# Patient Record
Sex: Male | Born: 1980
Health system: Southern US, Community
[De-identification: ages and names within clinical notes are randomized; demographics above are authoritative.]

## PROBLEM LIST (undated history)

## (undated) DIAGNOSIS — F1011 Alcohol abuse, in remission: Secondary | ICD-10-CM

## (undated) DIAGNOSIS — B019 Varicella without complication: Secondary | ICD-10-CM

## (undated) DIAGNOSIS — K219 Gastro-esophageal reflux disease without esophagitis: Secondary | ICD-10-CM

## (undated) DIAGNOSIS — F329 Major depressive disorder, single episode, unspecified: Secondary | ICD-10-CM

## (undated) DIAGNOSIS — F32A Depression, unspecified: Secondary | ICD-10-CM

## (undated) HISTORY — DX: Gastro-esophageal reflux disease without esophagitis: K21.9

## (undated) HISTORY — DX: Alcohol abuse, in remission: F10.11

## (undated) HISTORY — DX: Major depressive disorder, single episode, unspecified: F32.9

## (undated) HISTORY — DX: Depression, unspecified: F32.A

## (undated) HISTORY — PX: NO PAST SURGERIES: SHX2092

## (undated) HISTORY — DX: Varicella without complication: B01.9

---

## 2013-03-29 ENCOUNTER — Encounter: Payer: Self-pay | Admitting: Medical

## 2013-04-11 ENCOUNTER — Encounter: Payer: Self-pay | Admitting: Medical

## 2014-11-04 ENCOUNTER — Encounter: Payer: Self-pay | Admitting: Family Medicine

## 2014-11-04 ENCOUNTER — Ambulatory Visit (INDEPENDENT_AMBULATORY_CARE_PROVIDER_SITE_OTHER): Payer: BLUE CROSS/BLUE SHIELD | Admitting: Family Medicine

## 2014-11-04 VITALS — BP 106/62 | HR 93 | Temp 98.4°F | Ht 73.25 in | Wt 177.2 lb

## 2014-11-04 DIAGNOSIS — Z0000 Encounter for general adult medical examination without abnormal findings: Secondary | ICD-10-CM

## 2014-11-04 DIAGNOSIS — Z720 Tobacco use: Secondary | ICD-10-CM | POA: Diagnosis not present

## 2014-11-04 DIAGNOSIS — F329 Major depressive disorder, single episode, unspecified: Secondary | ICD-10-CM | POA: Diagnosis not present

## 2014-11-04 DIAGNOSIS — F339 Major depressive disorder, recurrent, unspecified: Secondary | ICD-10-CM | POA: Insufficient documentation

## 2014-11-04 DIAGNOSIS — Z13220 Encounter for screening for lipoid disorders: Secondary | ICD-10-CM

## 2014-11-04 DIAGNOSIS — F32A Depression, unspecified: Secondary | ICD-10-CM

## 2014-11-04 NOTE — Progress Notes (Signed)
Pre visit review using our clinic review tool, if applicable. No additional management support is needed unless otherwise documented below in the visit note. 

## 2014-11-04 NOTE — Assessment & Plan Note (Signed)
PHQ-9 = 14. Patient doing well at this time as he's had recent counseling. He declined pharmacologic treatment.  Will monitor.

## 2014-11-04 NOTE — Patient Instructions (Signed)
It was nice to see you today.  Follow at least annually.  Sooner if needed.  Take care  Dr. Adriana Simasook

## 2014-11-04 NOTE — Assessment & Plan Note (Signed)
Patient to get flu shot at work. Tetanus up-to-date per patient. Lipid panel today. Declined STD/HIV screening.

## 2014-11-04 NOTE — Progress Notes (Signed)
Subjective:  Patient ID: Wayne [name], male    DOB: 1980/05/26  Age: 34 y.o. MRN: [id number]  CC: Establish care; Depression  HPI Wayne [name] is a 34 y.o. male presents to the clinic today to establish care.  Preventative Healthcare  Immunizations: Will give flu at work. He states that his tetanus is up-to-date.  Labs: Screening lipid panel today.  Alcohol use: Has a history of alcohol abuse.  Smoking/tobacco use: Current every day smoker. He smokes a half pack a day.  STD/HIV testing: Declined.   Regular dental exams: Has upcoming dental appt.  Wears seat belt: Yes.  Depression  Patient reports that he's been experiencing depression for the past several weeks.  This started after discovering that his wife was unfaithful.  He's been going to counseling and his last session was 2 weeks ago. He states that he is now done with counseling.  He and his wife have elected to work it out and are still together.  He states that he is doing well at this time but still struggles periodically.  PMH, Surgical Hx, Family Hx, Social History reviewed and updated as below. Past Medical History  Diagnosis Date  . Depression   . Chicken pox   . History of alcohol abuse   . GERD (gastroesophageal reflux disease)    Past Surgical History  Procedure Laterality Date  . No past surgeries     Family History  Problem Relation Age of Onset  . Diabetes Mother   . Hypertension Mother   . Alcohol abuse Mother   . Cancer Paternal Aunt     lung  . Stroke Maternal Grandmother   . Diabetes Maternal Grandmother   . Heart disease Paternal Grandfather    Social History  Substance Use Topics  . Smoking status: Current Every Day Smoker -- 0.50 packs/day  . Smokeless tobacco: Never Used  . Alcohol Use: 0.6 - 1.2 oz/week    1-2 Cans of beer per week   Review of Systems  Musculoskeletal: Positive for myalgias and back pain.  Neurological:       Sexual difficulty.    Psychiatric/Behavioral:       Sadness, anxiety.  All other systems negative.   Objective:   Today's Vitals: BP 106/62 mmHg  Pulse 93  Temp(Src) 98.4 F (36.9 C) (Oral)  Ht 6' 1.25" (1.861 m)  Wt 177 lb 4 oz (80.4 kg)  BMI 23.21 kg/m2  SpO2 99%  Physical Exam  Constitutional: He is oriented to person, place, and time. He appears well-developed and well-nourished. No distress.  HENT:  Head: Normocephalic and atraumatic.  Nose: Nose normal.  Mouth/Throat: Oropharynx is clear and moist. No oropharyngeal exudate.  Normal TM's bilaterally.   Eyes: Conjunctivae are normal. No scleral icterus.  Neck: Neck supple. No thyromegaly present.  Cardiovascular: Normal rate and regular rhythm.   No murmur heard. Pulmonary/Chest: Effort normal and breath sounds normal. He has no wheezes. He has no rales.  Abdominal: Soft. He exhibits no distension. There is no tenderness. There is no rebound and no guarding.  Musculoskeletal: Normal range of motion. He exhibits no edema.  Lymphadenopathy:    He has no cervical adenopathy.  Neurological: He is alert and oriented to person, place, and time.  Skin: Skin is warm and dry. No rash noted.  Psychiatric:  Flat affect.  Vitals reviewed.  Assessment & Plan:   Problem List Items Addressed This Visit    Depression    PHQ-9 = 14. Patient  doing well at this time as he's had recent counseling. He declined pharmacologic treatment.  Will monitor.      Preventative health care - Primary    Patient to get flu shot at work. Tetanus up-to-date per patient. Lipid panel today. Declined STD/HIV screening.       Tobacco abuse    Other Visit Diagnoses    Screening for lipid disorders        Relevant Orders    Lipid panel      Follow-up: Annually; sooner if needed.    Tommie SamsJayce G Emmajean Ratledge DO

## 2016-03-01 ENCOUNTER — Encounter: Payer: Self-pay | Admitting: Family Medicine

## 2016-03-01 ENCOUNTER — Ambulatory Visit (INDEPENDENT_AMBULATORY_CARE_PROVIDER_SITE_OTHER): Payer: BLUE CROSS/BLUE SHIELD | Admitting: Family Medicine

## 2016-03-01 DIAGNOSIS — M545 Low back pain, unspecified: Secondary | ICD-10-CM | POA: Insufficient documentation

## 2016-03-01 MED ORDER — CYCLOBENZAPRINE HCL 10 MG PO TABS: 10.0000 mg | ORAL_TABLET | Freq: Three times a day (TID) | ORAL | 0 refills | Status: DC | PRN

## 2016-03-01 NOTE — Progress Notes (Signed)
Pre visit review using our clinic review tool, if applicable. No additional management support is needed unless otherwise documented below in the visit note. 

## 2016-03-01 NOTE — Assessment & Plan Note (Signed)
Patient with 2 days of right-sided low back discomfort likely representing muscular strain. No red flags. Neurologically intact in his lower extremities. Discussed heating pad and relative rest though he will continue to stay somewhat active. Ibuprofen dosing was provided to the patient. Advised to take with food. He will also be treated with a muscle relaxer. Advised that this could make him drowsy. He'll monitor and if not improving in the next several weeks could consider x-ray and physical therapy. Given return precautions.

## 2016-03-01 NOTE — Progress Notes (Signed)
  Wayne AlarEric Sonnenberg, MD Phone: [phone]  Wayne [name] is a 36 y.o. male who presents today for same-day visit.  Patient notes 2 days of low back pain. Notes he woke up yesterday with discomfort in his low back. No specific injury. Notes if he moves the wrong way he gets a sharp pain. Notes it has improved somewhat since yesterday. No radiation. Denies saddle anesthesia or bowel or bladder incontinence. He's used a heating pad, icy hot, and Tylenol with some benefit. He does have a history of low back pain in the past.   ROS see history of present illness  Objective  Physical Exam Vitals:   03/01/16 0946  BP: 138/80  Pulse: 89  Temp: 98.1 F (36.7 C)    BP Readings from Last 3 Encounters:  03/01/16 138/80  11/04/14 106/62   Wt Readings from Last 3 Encounters:  03/01/16 201 lb 1.9 oz (91.2 kg)  11/04/14 177 lb 4 oz (80.4 kg)    Physical Exam  Constitutional: No distress.  Cardiovascular: Normal rate, regular rhythm and normal heart sounds.   Pulmonary/Chest: Effort normal and breath sounds normal.  Musculoskeletal:  No midline spine tenderness, no midline spine step-off, no muscular back tenderness  Neurological: He is alert.  5 out of 5 strength bilateral quads, hamstrings, plantar flexion, and dorsiflexion, sensation to light touch intact in bilateral lower extremities  Skin: Skin is warm and dry. He is not diaphoretic.     Assessment/Plan: Please see individual problem list.  Low back pain Patient with 2 days of right-sided low back discomfort likely representing muscular strain. No red flags. Neurologically intact in his lower extremities. Discussed heating pad and relative rest though he will continue to stay somewhat active. Ibuprofen dosing was provided to the patient. Advised to take with food. He will also be treated with a muscle relaxer. Advised that this could make him drowsy. He'll monitor and if not improving in the next several weeks could consider x-ray  and physical therapy. Given return precautions.   No orders of the defined types were placed in this encounter.   Meds ordered this encounter  Medications  . cyclobenzaprine (FLEXERIL) 10 MG tablet    Sig: Take 1 tablet (10 mg total) by mouth 3 (three) times daily as needed for muscle spasms.    Dispense:  30 tablet    Refill:  0    Wayne AlarEric Sonnenberg, MD [facility]

## 2016-03-01 NOTE — Patient Instructions (Signed)
Nice to meet you. You likely strained a muscle in your back. You can use ibuprofen 800 mg by mouth every 8 hours as needed for pain. You can do this on a schedule every 8 hours for the next 2-3 days and then as needed. You can also take the muscle relaxer. This may make you drowsy so be careful on this. If you develop numbness, weakness, loss of bowel or bladder function, this between her legs, or any new or change in symptoms please seek medical attention immediately.

## 2016-04-01 ENCOUNTER — Encounter: Payer: Self-pay | Admitting: Family Medicine

## 2016-04-01 ENCOUNTER — Ambulatory Visit (INDEPENDENT_AMBULATORY_CARE_PROVIDER_SITE_OTHER): Payer: BLUE CROSS/BLUE SHIELD | Admitting: Family Medicine

## 2016-04-01 ENCOUNTER — Ambulatory Visit (INDEPENDENT_AMBULATORY_CARE_PROVIDER_SITE_OTHER): Payer: BLUE CROSS/BLUE SHIELD

## 2016-04-01 VITALS — BP 118/82 | HR 72 | Temp 97.6°F | Wt 204.4 lb

## 2016-04-01 DIAGNOSIS — M549 Dorsalgia, unspecified: Secondary | ICD-10-CM | POA: Diagnosis not present

## 2016-04-01 DIAGNOSIS — M545 Low back pain, unspecified: Secondary | ICD-10-CM | POA: Insufficient documentation

## 2016-04-01 NOTE — Progress Notes (Signed)
Pre visit review using our clinic review tool, if applicable. No additional management support is needed unless otherwise documented below in the visit note. 

## 2016-04-01 NOTE — Patient Instructions (Signed)
Ibuprofen 800 mg three times daily as needed. Exercises below (holds are for 3-5 seconds; 10 at a time).  We will call with your xray results.  Take care  Dr. Adriana Simasook    Low Back Rehab Ask your health care provider which exercises are safe for you. Do exercises exactly as told by your health care provider and adjust them as directed. It is normal to feel mild stretching, pulling, tightness, or discomfort as you do these exercises, but you should stop right away if you feel sudden pain or your pain gets worse. Do not begin these exercises until told by your health care provider. Stretching and range of motion exercises These exercises warm up your muscles and joints and improve the movement and flexibility of your back. These exercises also help to relieve pain, numbness, and tingling. Exercise A: Single knee to chest   1. Lie on your back on a firm surface with both legs straight. 2. Bend one of your knees. Use your hands to move your knee up toward your chest until you feel a gentle stretch in your lower back and buttock.  Hold your leg in this position by holding onto the front of your knee.  Keep your other leg as straight as possible. 3. Hold for __________ seconds. 4. Slowly return to the starting position. 5. Repeat with your other leg. Repeat __________ times. Complete this exercise __________ times a day. Exercise B: Prone extension on elbows   1. Lie on your abdomen on a firm surface. 2. Prop yourself up on your elbows. 3. Use your arms to help lift your chest up until you feel a gentle stretch in your abdomen and your lower back.  This will place some of your body weight on your elbows. If this is uncomfortable, try stacking pillows under your chest.  Your hips should stay down, against the surface that you are lying on. Keep your hip and back muscles relaxed. 4. Hold for __________ seconds. 5. Slowly relax your upper body and return to the starting position. Repeat  __________ times. Complete this exercise __________ times a day. Strengthening exercises These exercises build strength and endurance in your back. Endurance is the ability to use your muscles for a long time, even after they get tired. Exercise C: Pelvic tilt  1. Lie on your back on a firm surface. Bend your knees and keep your feet flat. 2. Tense your abdominal muscles. Tip your pelvis up toward the ceiling and flatten your lower back into the floor.  To help with this exercise, you may place a small towel under your lower back and try to push your back into the towel. 3. Hold for __________ seconds. 4. Let your muscles relax completely before you repeat this exercise. Repeat __________ times. Complete this exercise __________ times a day. Exercise D: Alternating arm and leg raises   1. Get on your hands and knees on a firm surface. If you are on a hard floor, you may want to use padding to cushion your knees, such as an exercise mat. 2. Line up your arms and legs. Your hands should be below your shoulders, and your knees should be below your hips. 3. Lift your left leg behind you. At the same time, raise your right arm and straighten it in front of you.  Do not lift your leg higher than your hip.  Do not lift your arm higher than your shoulder.  Keep your abdominal and back muscles tight.  Keep your  hips facing the ground.  Do not arch your back.  Keep your balance carefully, and do not hold your breath. 4. Hold for __________ seconds. 5. Slowly return to the starting position and repeat with your right leg and your left arm. Repeat __________ times. Complete this exercise __________times a day. Exercise J: Single leg lower with bent knees  1. Lie on your back on a firm surface. 2. Tense your abdominal muscles and lift your feet off the floor, one foot at a time, so your knees and hips are bent in an "L" shape (at about 90 degrees).  Your knees should be over your hips and your  lower legs should be parallel to the floor. 3. Keeping your abdominal muscles tense and your knee bent, slowly lower one of your legs so your toe touches the ground. 4. Lift your leg back up to return to the starting position.  Do not hold your breath.  Do not let your back arch. Keep your back flat against the ground. 5. Repeat with your other leg. Repeat __________ times. Complete this exercise __________ times a day. Posture and body mechanics   Body mechanics refers to the movements and positions of your body while you do your daily activities. Posture is part of body mechanics. Good posture and healthy body mechanics can help to relieve stress in your body's tissues and joints. Good posture means that your spine is in its natural S-curve position (your spine is neutral), your shoulders are pulled back slightly, and your head is not tipped forward. The following are general guidelines for applying improved posture and body mechanics to your everyday activities. Standing    When standing, keep your spine neutral and your feet about hip-width apart. Keep a slight bend in your knees. Your ears, shoulders, and hips should line up.  When you do a task in which you stand in one place for a long time, place one foot up on a stable object that is 2-4 inches (5-10 cm) high, such as a footstool. This helps keep your spine neutral. Sitting    When sitting, keep your spine neutral and keep your feet flat on the floor. Use a footrest, if necessary, and keep your thighs parallel to the floor. Avoid rounding your shoulders, and avoid tilting your head forward.  When working at a desk or a computer, keep your desk at a height where your hands are slightly lower than your elbows. Slide your chair under your desk so you are close enough to maintain good posture.  When working at a computer, place your monitor at a height where you are looking straight ahead and you do not have to tilt your head forward or  downward to look at the screen. Resting    When lying down and resting, avoid positions that are most painful for you.  If you have pain with activities such as sitting, bending, stooping, or squatting (flexion-based activities), lie in a position in which your body does not bend very much. For example, avoid curling up on your side with your arms and knees near your chest (fetal position).  If you have pain with activities such as standing for a long time or reaching with your arms (extension-based activities), lie with your spine in a neutral position and bend your knees slightly. Try the following positions:  Lying on your side with a pillow between your knees.  Lying on your back with a pillow under your knees. Lifting    When  lifting objects, keep your feet at least shoulder-width apart and tighten your abdominal muscles.  Bend your knees and hips and keep your spine neutral. It is important to lift using the strength of your legs, not your back. Do not lock your knees straight out.  Always ask for help to lift heavy or awkward objects. This information is not intended to replace advice given to you by your health care provider. Make sure you discuss any questions you have with your health care provider. Document Released: 12/28/2004 Document Revised: 09/04/2015 Document Reviewed: 10/09/2014 Elsevier Interactive Patient Education  2017 [employer].

## 2016-04-01 NOTE — Assessment & Plan Note (Signed)
Established problem, Stable. Xray today given chronicity. Advised exercises (handout given). PRN NSAID's.

## 2016-04-01 NOTE — Progress Notes (Signed)
   Subjective:  Patient ID: Wayne [name], male    DOB: 1980-08-24  Age: 36 y.o. MRN: [id number]  CC: Low back pain  HPI:  36 year old male presents for follow up regarding low back pain.  Low back pain  Patient was recently seen for low back pain in February.  Thought to be MSK in origin and was treated with flexeril.  He states that he continues to have mild low back pain.  Located predominantly on the right side.  No current radicular complaints.  He states that it's mild. When he has exacerbations it gets quite severe and causes him to be "out of commission" for 4 days.  He is concerned about this and the fact that he has had in the past. He wants to know the cause. He would like to discuss this today.   Social Hx   Social History   Social History  . Marital status: Married    Spouse name: N/A  . Number of children: N/A  . Years of education: N/A   Social History Main Topics  . Smoking status: Current Every Day Smoker    Packs/day: 0.50  . Smokeless tobacco: Never Used  . Alcohol use 0.6 - 1.2 oz/week    1 - 2 Cans of beer per week  . Drug use: Yes    Types: Marijuana  . Sexual activity: Yes    Birth control/ protection: None   Other Topics Concern  . None   Social History Narrative  . None    Review of Systems  Constitutional: Negative.   Musculoskeletal: Positive for back pain.   Objective:  BP 118/82   Pulse 72   Temp 97.6 F (36.4 C) (Oral)   Wt 204 lb 6 oz (92.7 kg)   SpO2 98%   BMI 26.78 kg/m   BP/Weight 04/01/2016 03/01/2016 11/04/2014  Systolic BP 118 138 106  Diastolic BP 82 80 62  Wt. (Lbs) 204.38 201.12 177.25  BMI 26.78 26.35 23.21    Physical Exam  Constitutional: He is oriented to person, place, and time. He appears well-developed. No distress.  Cardiovascular: Normal rate and regular rhythm.   Pulmonary/Chest: Effort normal and breath sounds normal.  Musculoskeletal:  Low back - No discrete areas of tenderness. Neg  straight leg raise.  Neurological: He is alert and oriented to person, place, and time.  Vitals reviewed.  Assessment & Plan:   Problem List Items Addressed This Visit    Right-sided low back pain without sciatica - Primary    Established problem, Stable. Xray today given chronicity. Advised exercises (handout given). PRN NSAID's.      Relevant Orders   DG Lumbar Spine 2-3 Views     Follow-up: PRN  Everlene OtherJayce Tamika Nou DO [facility] [place]

## 2017-04-27 IMAGING — DX DG LUMBAR SPINE 2-3V
3 series · 3 of 3 positions shown · non-contrast
Comparison: No recent prior .

CLINICAL DATA: Back pain.

EXAM:
LUMBAR SPINE - 2-3 VIEW

[lumbar spine ap]
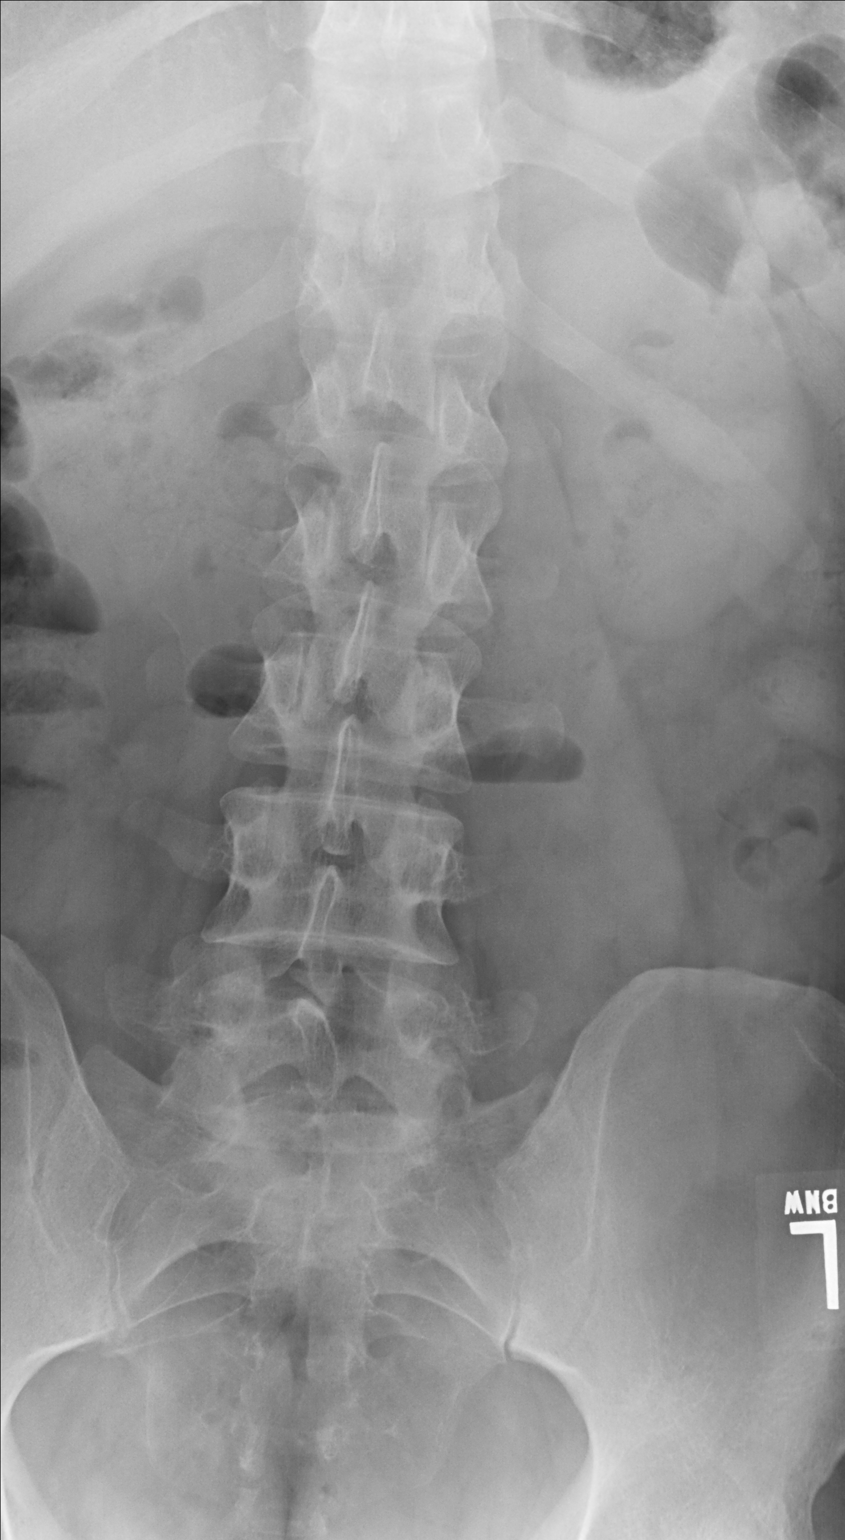

[lumbar spine lat]
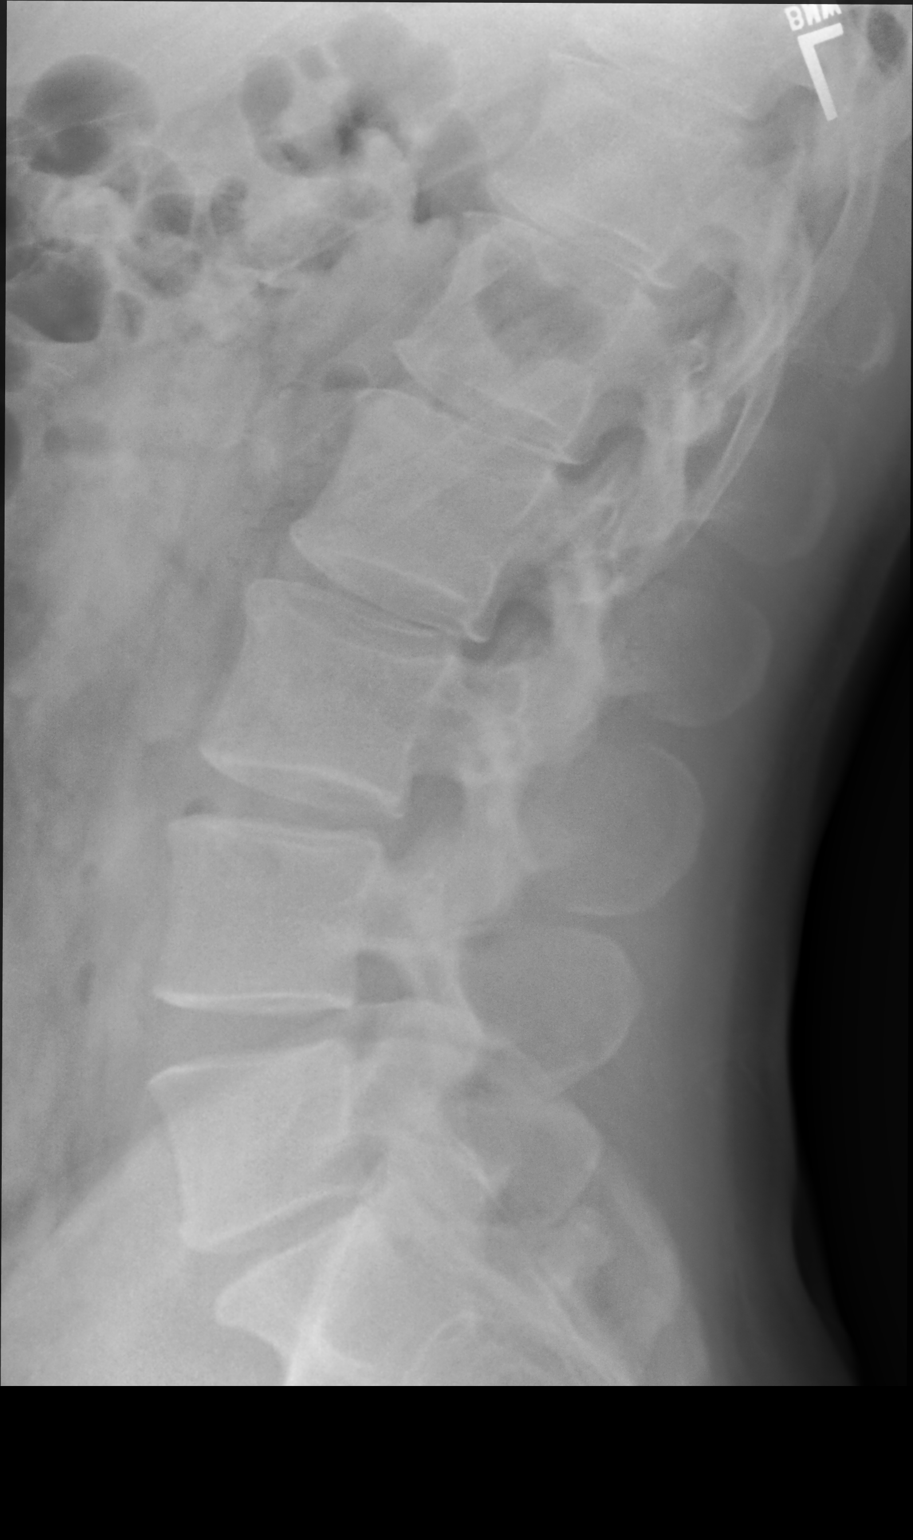

[lumbar spot lat]
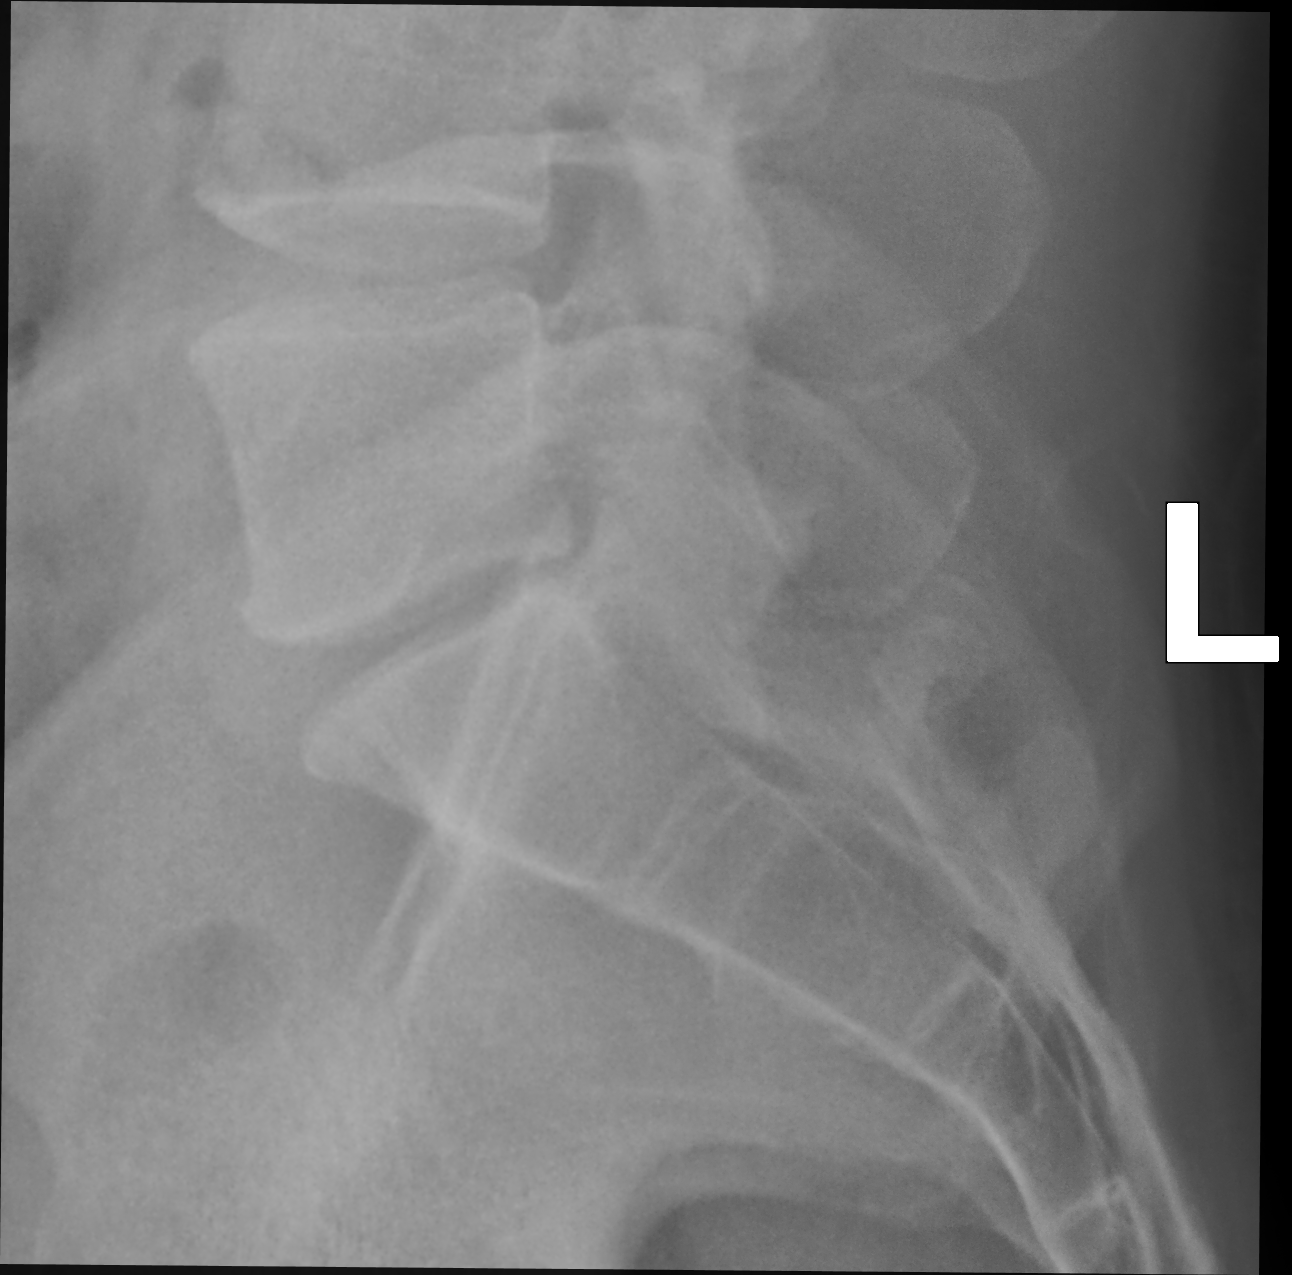

[3 of 3 positions shown; findings below may reference images not displayed]

FINDINGS: Mild scoliosis concave right. No acute bony abnormality identified.
Normal alignment and mineralization.
IMPRESSION: Mild scoliosis concave right.  No acute abnormality.

## 2017-10-03 DIAGNOSIS — M5387 Other specified dorsopathies, lumbosacral region: Secondary | ICD-10-CM | POA: Diagnosis not present

## 2017-10-03 DIAGNOSIS — M9903 Segmental and somatic dysfunction of lumbar region: Secondary | ICD-10-CM | POA: Diagnosis not present

## 2017-10-07 DIAGNOSIS — M9903 Segmental and somatic dysfunction of lumbar region: Secondary | ICD-10-CM | POA: Diagnosis not present

## 2017-10-07 DIAGNOSIS — M5387 Other specified dorsopathies, lumbosacral region: Secondary | ICD-10-CM | POA: Diagnosis not present

## 2017-11-01 DIAGNOSIS — M62838 Other muscle spasm: Secondary | ICD-10-CM | POA: Diagnosis not present

## 2017-11-01 DIAGNOSIS — M9903 Segmental and somatic dysfunction of lumbar region: Secondary | ICD-10-CM | POA: Diagnosis not present

## 2017-11-01 DIAGNOSIS — M5387 Other specified dorsopathies, lumbosacral region: Secondary | ICD-10-CM | POA: Diagnosis not present

## 2017-11-02 DIAGNOSIS — M5387 Other specified dorsopathies, lumbosacral region: Secondary | ICD-10-CM | POA: Diagnosis not present

## 2017-11-02 DIAGNOSIS — M62838 Other muscle spasm: Secondary | ICD-10-CM | POA: Diagnosis not present

## 2017-11-02 DIAGNOSIS — M9903 Segmental and somatic dysfunction of lumbar region: Secondary | ICD-10-CM | POA: Diagnosis not present

## 2017-11-11 DIAGNOSIS — M5387 Other specified dorsopathies, lumbosacral region: Secondary | ICD-10-CM | POA: Diagnosis not present

## 2017-11-11 DIAGNOSIS — M9903 Segmental and somatic dysfunction of lumbar region: Secondary | ICD-10-CM | POA: Diagnosis not present

## 2019-07-12 ENCOUNTER — Other Ambulatory Visit: Payer: Self-pay

## 2019-07-12 ENCOUNTER — Ambulatory Visit (INDEPENDENT_AMBULATORY_CARE_PROVIDER_SITE_OTHER): Payer: BC Managed Care – PPO | Admitting: Nurse Practitioner

## 2019-07-12 ENCOUNTER — Encounter: Payer: Self-pay | Admitting: Nurse Practitioner

## 2019-07-12 VITALS — BP 116/80 | HR 87 | Temp 98.3°F | Ht 74.25 in | Wt 216.2 lb

## 2019-07-12 DIAGNOSIS — Z720 Tobacco use: Secondary | ICD-10-CM

## 2019-07-12 DIAGNOSIS — K760 Fatty (change of) liver, not elsewhere classified: Secondary | ICD-10-CM

## 2019-07-12 DIAGNOSIS — R932 Abnormal findings on diagnostic imaging of liver and biliary tract: Secondary | ICD-10-CM | POA: Diagnosis not present

## 2019-07-12 DIAGNOSIS — R748 Abnormal levels of other serum enzymes: Secondary | ICD-10-CM

## 2019-07-12 DIAGNOSIS — Z0000 Encounter for general adult medical examination without abnormal findings: Secondary | ICD-10-CM | POA: Diagnosis not present

## 2019-07-12 DIAGNOSIS — F339 Major depressive disorder, recurrent, unspecified: Secondary | ICD-10-CM | POA: Diagnosis not present

## 2019-07-12 MED ORDER — SERTRALINE HCL 50 MG PO TABS
50.0000 mg | ORAL_TABLET | Freq: Every day | ORAL | 0 refills | Status: DC
Start: 2019-07-12 — End: 2019-08-15

## 2019-07-12 NOTE — Patient Instructions (Addendum)
Please go to the lab today.  Reduce alcohol use by half  I placed urgent referral in to psychiatry let me know if you do not get a call in 1 week.    Zoloft to start 1/2 tablet once daily for 1 week and then increase to a full tablet once daily on week two as tolerated.  We discussed common side effects such as nausea, drowsiness and weight gain.  Also discussed rare but serious side effect of suicide ideation.  He  is instructed to discontinue medication go directly to ED if this occurs.   Plan follow up in 1 month to evaluate progress.      Depression Screening Depression screening is a tool that your health care provider can use to learn if you have symptoms of depression. Depression is a common condition with many symptoms that are also often found in other conditions. Depression is treatable, but it must first be diagnosed. You may not know that certain feelings, thoughts, and behaviors that you are having can be symptoms of depression. Taking a depression screening test can help you and your health care provider decide if you need more assessment, or if you should be referred to a mental health care provider. What are the screening tests?  You may have a physical exam to see if another condition is affecting your mental health. You may have a blood or urine sample taken during the physical exam.  You may be interviewed using a screening tool that was developed from research, such as one of these: ? Patient Health Questionnaire (PHQ). This is a set of either 2 or 9 questions. A health care provider who has been trained to score this screening test uses a guide to assess if your symptoms suggest that you may have depression. ? Hamilton Depression Rating Scale (HAM-D). This is a set of either 17 or 24 questions. You may be asked to take it again during or after your treatment, to see if your depression has gotten better. ? Beck Depression Inventory (BDI). This is a set of 21 multiple choice  questions. Your health care provider scores your answers to assess:  Your level of depression, ranging from mild to severe.  Your response to treatment.  Your health care provider may talk with you about your daily activities, such as eating, sleeping, work, and recreation, and ask if you have had any changes in activity.  Your health care provider may ask you to see a mental health specialist, such as a psychiatrist or psychologist, for more evaluation. Who should be screened for depression?   All adults, including adults with a family history of a mental health disorder.  Adolescents who are 39-46 years old.  People who are recovering from a myocardial infarction (MI).  Pregnant women, or women who have given birth.  People who have a long-term (chronic) illness.  Anyone who has been diagnosed with another type of a mental health disorder.  Anyone who has symptoms that could show depression. What do my results mean? Your health care provider will review the results of your depression screening, physical exam, and lab tests. Positive screens suggest that you may have depression. Screening is the first step in getting the care that you may need. It is up to you to get your screening results. Ask your health care provider, or the department that is doing your screening tests, when your results will be ready. Talk with your health care provider about your results and diagnosis. A  diagnosis of depression is made using the Diagnostic and Statistical Manual of Mental Disorders (DSM-V). This is a book that lists the number and type of symptoms that must be present for a health care provider to give a specific diagnosis.  Your health care provider may work with you to treat your symptoms of depression, or your health care provider may help you find a mental health provider who can assess, diagnose, and treat your depression. Get help right away if:  You have thoughts about hurting yourself or  others. If you ever feel like you may hurt yourself or others, or have thoughts about taking your own life, get help right away. You can go to your nearest emergency department or call:  Your local emergency services (911 in the [place]  A suicide crisis helpline, such as the National Suicide Prevention Lifeline at 312-613-91561-(786)619-2470. This is open 24 hours a day. Summary  Depression screening is the first step in getting the help that you may need.  If your screening test shows symptoms of depression (is positive), your health care provider may ask you to see a mental health provider.  Anyone who is age 38 or older should be screened for depression. This information is not intended to replace advice given to you by your health care provider. Make sure you discuss any questions you have with your health care provider. Document Revised: 12/10/2016 Document Reviewed: 05/14/2016 Elsevier Patient Education  2020 [employer]  Major Depressive Disorder, Adult Major depressive disorder (MDD) is a mental health condition. It may also be called clinical depression or unipolar depression. MDD usually causes feelings of sadness, hopelessness, or helplessness. MDD can also cause physical symptoms. It can interfere with work, school, relationships, and other everyday activities. MDD may be mild, moderate, or severe. It may occur once (single episode major depressive disorder) or it may occur multiple times (recurrent major depressive disorder). What are the causes? The exact cause of this condition is not known. MDD is most likely caused by a combination of things, which may include:  Genetic factors. These are traits that are passed along from parent to child.  Individual factors. Your personality, your behavior, and the way you handle your thoughts and feelings may contribute to MDD. This includes personality traits and behaviors learned from others.  Physical factors, such as: ? Differences in the part  of your brain that controls emotion. This part of your brain may be different than it is in people who do not have MDD. ? Long-term (chronic) medical or psychiatric illnesses.  Social factors. Traumatic experiences or major life changes may play a role in the development of MDD. What increases the risk? This condition is more likely to develop in women. The following factors may also make you more likely to develop MDD:  A family history of depression.  Troubled family relationships.  Abnormally low levels of certain brain chemicals.  Traumatic events in childhood, especially abuse or the loss of a parent.  Being under a lot of stress, or long-term stress, especially from upsetting life experiences or losses.  A history of: ? Chronic physical illness. ? Other mental health disorders. ? Substance abuse.  Poor living conditions.  Experiencing social exclusion or discrimination on a regular basis. What are the signs or symptoms? The main symptoms of MDD typically include:  Constant depressed or irritable mood.  Loss of interest in things and activities. MDD symptoms may also include:  Sleeping or eating too much or too little.  Unexplained weight change.  Fatigue or low energy.  Feelings of worthlessness or guilt.  Difficulty thinking clearly or making decisions.  Thoughts of suicide or of harming others.  Physical agitation or weakness.  Isolation. Severe cases of MDD may also occur with other symptoms, such as:  Delusions or hallucinations, in which you imagine things that are not real (psychotic depression).  Low-level depression that lasts at least a year (chronic depression or persistent depressive disorder).  Extreme sadness and hopelessness (melancholic depression).  Trouble speaking and moving (catatonic depression). How is this diagnosed? This condition may be diagnosed based on:  Your symptoms.  Your medical history, including your mental health  history. This may involve tests to evaluate your mental health. You may be asked questions about your lifestyle, including any drug and alcohol use, and how long you have had symptoms of MDD.  A physical exam.  Blood tests to rule out other conditions. You must have a depressed mood and at least four other MDD symptoms most of the day, nearly every day in the same 2-week timeframe before your health care provider can confirm a diagnosis of MDD. How is this treated? This condition is usually treated by mental health professionals, such as psychologists, psychiatrists, and clinical social workers. You may need more than one type of treatment. Treatment may include:  Psychotherapy. This is also called talk therapy or counseling. Types of psychotherapy include: ? Cognitive behavioral therapy (CBT). This type of therapy teaches you to recognize unhealthy feelings, thoughts, and behaviors, and replace them with positive thoughts and actions. ? Interpersonal therapy (IPT). This helps you to improve the way you relate to and communicate with others. ? Family therapy. This treatment includes members of your family.  Medicine to treat anxiety and depression, or to help you control certain emotions and behaviors.  Lifestyle changes, such as: ? Limiting alcohol and drug use. ? Exercising regularly. ? Getting plenty of sleep. ? Making healthy eating choices. ? Spending more time outdoors.  Treatments involving stimulation of the brain can be used in situations with extremely severe symptoms, or when medicine or other therapies do not work over time. These treatments include electroconvulsive therapy, transcranial magnetic stimulation, and vagal nerve stimulation. Follow these instructions at home: Activity  Return to your normal activities as told by your health care provider.  Exercise regularly and spend time outdoors as told by your health care provider. General instructions  Take  over-the-counter and prescription medicines only as told by your health care provider.  Do not drink alcohol. If you drink alcohol, limit your alcohol intake to no more than 1 drink a day for nonpregnant women and 2 drinks a day for men. One drink equals 12 oz of beer, 5 oz of wine, or 1 oz of hard liquor. Alcohol can affect any antidepressant medicines you are taking. Talk to your health care provider about your alcohol use.  Eat a healthy diet and get plenty of sleep.  Find activities that you enjoy doing, and make time to do them.  Consider joining a support group. Your health care provider may be able to recommend a support group.  Keep all follow-up visits as told by your health care provider. This is important. Where to find more information [employer] on Mental Illness  www.nami.org [place] General [place] [employer] of Mental Health  http://www.maynard.net/ National Suicide Prevention Lifeline  1-800-273-TALK (508) 466-9425). This is free, 24-hour help. Contact a health care provider if:  Your symptoms get worse.  You develop  new symptoms. Get help right away if:  You self-harm.  You have serious thoughts about hurting yourself or others.  You see, hear, taste, smell, or feel things that are not present (hallucinate). This information is not intended to replace advice given to you by your health care provider. Make sure you discuss any questions you have with your health care provider. Document Revised: 12/10/2016 Document Reviewed: 07/09/2015 Elsevier Patient Education  2020 [employer].

## 2019-07-12 NOTE — Progress Notes (Signed)
New Patient Office Visit  Subjective:  Patient ID: Wayne [name], male    DOB: [birth year]  Age: 38 y.o. MRN: [id number]  CC:  Chief Complaint  Patient presents with  . Establish Care  . Depression    HPI Wayne [name] presents to establish care with a new primary care provider. His main concern is mental health: depression x years. He has more stress at work and his ability to manage it " is not sufficient to the task."  He would like to see a psychiatry and begin medication.his alcohol consumption is 3 days out of the week and he will drink 6 or 7 shots of liquor each day.  He knows to cut back.  He smokes 1/2 pack/day for 20 years.  He denies illicit drug use.  He has had no suicidal thoughts or plans.  He has no homicidal ideation.  His PH Q-9 score is high today 22 and GAD-7: score is 16.  We discussed his answers today.  Past Medical History:  Diagnosis Date  . Chicken pox   . Depression   . GERD (gastroesophageal reflux disease)   . History of alcohol abuse     Past Surgical History:  Procedure Laterality Date  . NO PAST SURGERIES      Family History  Problem Relation Age of Onset  . Diabetes Mother   . Hypertension Mother   . Alcohol abuse Mother   . Cancer Paternal Aunt        lung  . Stroke Maternal Grandmother   . Diabetes Maternal Grandmother   . Heart disease Paternal Grandfather     Social History   Socioeconomic History  . Marital status: Married    Spouse name: Not on file  . Number of children: Not on file  . Years of education: Not on file  . Highest education level: Not on file  Occupational History  . Not on file  Tobacco Use  . Smoking status: Current Every Day Smoker    Packs/day: 0.50  . Smokeless tobacco: Never Used  Substance and Sexual Activity  . Alcohol use: Yes    Alcohol/week: 1.0 - 2.0 standard drink    Types: 1 - 2 Cans of beer per week  . Drug use: Yes    Types: Marijuana  . Sexual activity: Yes    Birth  control/protection: None  Other Topics Concern  . Not on file  Social History Narrative  . Not on file   Social Determinants of Health   Financial Resource Strain:   . Difficulty of Paying Living Expenses:   Food Insecurity:   . Worried About Programme researcher, broadcasting/film/video in the Last Year:   . Barista in the Last Year:   Transportation Needs:   . Freight forwarder (Medical):   Marland Kitchen Lack of Transportation (Non-Medical):   Physical Activity:   . Days of Exercise per Week:   . Minutes of Exercise per Session:   Stress:   . Feeling of Stress :   Social Connections:   . Frequency of Communication with Friends and Family:   . Frequency of Social Gatherings with Friends and Family:   . Attends Religious Services:   . Active Member of Clubs or Organizations:   . Attends Banker Meetings:   Marland Kitchen Marital Status:   Intimate Partner Violence:   . Fear of Current or Ex-Partner:   . Emotionally Abused:   Marland Kitchen Physically Abused:   .  Sexually Abused:     Review of Systems  Constitutional: Negative.   HENT: Negative.   Eyes: Negative.   Respiratory: Negative.   Cardiovascular: Negative.   Gastrointestinal: Negative.   Endocrine: Negative.   Genitourinary: Positive for difficulty urinating.  Musculoskeletal: Negative.   Allergic/Immunologic: Negative.   Neurological: Negative.   Hematological: Negative.     Objective:   Today's Vitals: BP 116/80   Pulse 87   Temp 98.3 F (36.8 C) (Oral)   Ht 6' 2.25" (1.886 m)   Wt 216 lb 3.2 oz (98.1 kg)   SpO2 98%   BMI 27.57 kg/m   Physical Exam Vitals reviewed.  Constitutional:      Appearance: Normal appearance. He is normal weight.  Eyes:     Pupils: Pupils are equal, round, and reactive to light.  Cardiovascular:     Rate and Rhythm: Normal rate and regular rhythm.     Pulses: Normal pulses.     Heart sounds: Normal heart sounds.  Pulmonary:     Effort: Pulmonary effort is normal.     Breath sounds: Normal breath  sounds.  Musculoskeletal:        General: Normal range of motion.     Cervical back: Normal range of motion and neck supple.  Skin:    General: Skin is warm and dry.  Neurological:     General: No focal deficit present.     Mental Status: He is alert and oriented to person, place, and time.  Psychiatric:        Mood and Affect: Mood normal.        Behavior: Behavior normal.        Thought Content: Thought content normal.        Judgment: Judgment normal.     Assessment & Plan:   Problem List Items Addressed This Visit      Other   Preventative health care   Depression, recurrent (HCC) - Primary   Relevant Medications   sertraline (ZOLOFT) 50 MG tablet   Other Relevant Orders   Ambulatory referral to Psychiatry   CBC with Differential/Platelet   TSH   Comprehensive metabolic panel   Hemoglobin A1c   Lipid panel   VITAMIN D 25 Hydroxy (Vit-D Deficiency, Fractures)   B12 and Folate Panel   Tobacco abuse      Outpatient Encounter Medications as of 07/12/2019  Medication Sig  . sertraline (ZOLOFT) 50 MG tablet Take 1 tablet (50 mg total) by mouth at bedtime. Take 1/2 tablet at bedtime for 1 week and then 1 tablet nightly  . [DISCONTINUED] cyclobenzaprine (FLEXERIL) 10 MG tablet Take 1 tablet (10 mg total) by mouth 3 (three) times daily as needed for muscle spasms.   No facility-administered encounter medications on file as of 07/12/2019.  Please go to the lab today.  Reduce alcohol use by half  I placed urgent referral in to psychiatry let me know if you do not get a call in 1 week.    Zoloft to start 1/2 tablet once daily for 1 week and then increase to a full tablet once daily on week two as tolerated.  We discussed common side effects such as nausea, drowsiness and weight gain.  Also discussed rare but serious side effect of suicide ideation.  He  is instructed to discontinue medication go directly to ED if this occurs.   Plan follow up in 1 month to evaluate progress.      Follow-up: Return in about 4 weeks (  around 08/09/2019).  This visit occurred during the SARS-CoV-2 public health emergency.  Safety protocols were in place, including screening questions prior to the visit, additional usage of staff PPE, and extensive cleaning of exam room while observing appropriate contact time as indicated for disinfecting solutions.    Amedeo Kinsman, NP

## 2019-07-13 LAB — COMPREHENSIVE METABOLIC PANEL
ALT: 114 IU/L — ABNORMAL HIGH (ref 0–44)
AST: 40 IU/L (ref 0–40)
Albumin/Globulin Ratio: 2.1 (ref 1.2–2.2)
Albumin: 4.8 g/dL (ref 4.0–5.0)
Alkaline Phosphatase: 81 IU/L (ref 48–121)
BUN/Creatinine Ratio: 12 (ref 9–20)
BUN: 12 mg/dL (ref 6–20)
Bilirubin Total: 0.4 mg/dL (ref 0.0–1.2)
CO2: 22 mmol/L (ref 20–29)
Calcium: 10.1 mg/dL (ref 8.7–10.2)
Chloride: 104 mmol/L (ref 96–106)
Creatinine, Ser: 1.04 mg/dL (ref 0.76–1.27)
GFR calc Af Amer: 105 mL/min/{1.73_m2} (ref >59)
GFR calc non Af Amer: 91 mL/min/{1.73_m2} (ref >59)
Globulin, Total: 2.3 g/dL (ref 1.5–4.5)
Glucose: 76 mg/dL (ref 65–99)
Potassium: 4.4 mmol/L (ref 3.5–5.2)
Sodium: 141 mmol/L (ref 134–144)
Total Protein: 7.1 g/dL (ref 6.0–8.5)

## 2019-07-13 LAB — CBC WITH DIFFERENTIAL/PLATELET
Basophils Absolute: 0.1 10*3/uL (ref 0.0–0.2)
Basos: 1 %
EOS (ABSOLUTE): 0.3 10*3/uL (ref 0.0–0.4)
Eos: 3 %
Hematocrit: 46.5 % (ref 37.5–51.0)
Hemoglobin: 15.7 g/dL (ref 13.0–17.7)
Immature Grans (Abs): 0.1 10*3/uL (ref 0.0–0.1)
Immature Granulocytes: 1 %
Lymphocytes Absolute: 3 10*3/uL (ref 0.7–3.1)
Lymphs: 31 %
MCH: 30 pg (ref 26.6–33.0)
MCHC: 33.8 g/dL (ref 31.5–35.7)
MCV: 89 fL (ref 79–97)
Monocytes Absolute: 0.7 10*3/uL (ref 0.1–0.9)
Monocytes: 7 %
Neutrophils Absolute: 5.5 10*3/uL (ref 1.4–7.0)
Neutrophils: 57 %
Platelets: 307 10*3/uL (ref 150–450)
RBC: 5.24 x10E6/uL (ref 4.14–5.80)
RDW: 12.4 % (ref 11.6–15.4)
WBC: 9.6 10*3/uL (ref 3.4–10.8)

## 2019-07-13 LAB — LIPID PANEL
Chol/HDL Ratio: 5.9 ratio — ABNORMAL HIGH (ref 0.0–5.0)
Cholesterol, Total: 224 mg/dL — ABNORMAL HIGH (ref 100–199)
HDL: 38 mg/dL — ABNORMAL LOW (ref >39)
LDL Chol Calc (NIH): 155 mg/dL — ABNORMAL HIGH (ref 0–99)
Triglycerides: 168 mg/dL — ABNORMAL HIGH (ref 0–149)
VLDL Cholesterol Cal: 31 mg/dL (ref 5–40)

## 2019-07-13 LAB — HEMOGLOBIN A1C
Est. average glucose Bld gHb Est-mCnc: 103 mg/dL
Hgb A1c MFr Bld: 5.2 % (ref 4.8–5.6)

## 2019-07-13 LAB — B12 AND FOLATE PANEL
Folate: 6.2 ng/mL (ref >3.0)
Vitamin B-12: 423 pg/mL (ref 232–1245)

## 2019-07-13 LAB — VITAMIN D 25 HYDROXY (VIT D DEFICIENCY, FRACTURES): Vit D, 25-Hydroxy: 21.4 ng/mL — ABNORMAL LOW (ref 30.0–100.0)

## 2019-07-13 LAB — TSH: TSH: 0.988 u[IU]/mL (ref 0.450–4.500)

## 2019-07-20 NOTE — Addendum Note (Signed)
Addended by: Amedeo Kinsman A on: 07/20/2019 05:35 PM   Modules accepted: Orders

## 2019-07-23 ENCOUNTER — Telehealth: Payer: Self-pay | Admitting: Nurse Practitioner

## 2019-07-23 NOTE — Telephone Encounter (Signed)
lft vm for pt to call ofc to sch [place] 

## 2019-07-24 ENCOUNTER — Other Ambulatory Visit: Payer: Self-pay

## 2019-07-24 ENCOUNTER — Other Ambulatory Visit (INDEPENDENT_AMBULATORY_CARE_PROVIDER_SITE_OTHER): Payer: BLUE CROSS/BLUE SHIELD

## 2019-07-24 ENCOUNTER — Telehealth: Payer: Self-pay | Admitting: Nurse Practitioner

## 2019-07-24 DIAGNOSIS — R748 Abnormal levels of other serum enzymes: Secondary | ICD-10-CM

## 2019-07-24 NOTE — Telephone Encounter (Signed)
Pt came in for labs and asked about referral. Please call him back at [phone]. He works third shift so he prefers early morning appts

## 2019-07-24 NOTE — Telephone Encounter (Signed)
Left pt vm to call ofc to schedule [place].

## 2019-07-24 NOTE — Addendum Note (Signed)
Addended by: Bonnell Public I on: 07/24/2019 09:35 AM   Modules accepted: Orders

## 2019-07-25 LAB — IRON,TIBC AND FERRITIN PANEL
Ferritin: 224 ng/mL (ref 30–400)
Iron Saturation: 22 % (ref 15–55)
Iron: 68 ug/dL (ref 38–169)
Total Iron Binding Capacity: 312 ug/dL (ref 250–450)
UIBC: 244 ug/dL (ref 111–343)

## 2019-07-25 LAB — ACUTE HEP PANEL AND HEP B SURFACE AB
Hep A IgM: NEGATIVE
Hep B C IgM: NEGATIVE
Hep C Virus Ab: <0.1 s/co ratio (ref 0.0–0.9)
Hepatitis B Surf Ab Quant: 68.6 m[IU]/mL (ref >9.9)
Hepatitis B Surface Ag: NEGATIVE

## 2019-07-25 LAB — MITOCHONDRIAL/SMOOTH MUSCLE AB PNL
Mitochondrial Ab: <20 Units (ref 0.0–20.0)
Smooth Muscle Ab: 4 Units (ref 0–19)

## 2019-07-25 LAB — ANA: Anti Nuclear Antibody (ANA): NEGATIVE

## 2019-07-25 LAB — HEPATIC FUNCTION PANEL
ALT: 75 IU/L — ABNORMAL HIGH (ref 0–44)
AST: 35 IU/L (ref 0–40)
Albumin: 4.8 g/dL (ref 4.0–5.0)
Alkaline Phosphatase: 84 IU/L (ref 48–121)
Bilirubin Total: 0.3 mg/dL (ref 0.0–1.2)
Bilirubin, Direct: 0.1 mg/dL (ref 0.00–0.40)
Total Protein: 7 g/dL (ref 6.0–8.5)

## 2019-07-25 LAB — HEPATITIS A ANTIBODY, TOTAL: hep A Total Ab: POSITIVE — AB

## 2019-07-30 ENCOUNTER — Encounter: Payer: Self-pay | Admitting: Nurse Practitioner

## 2019-07-31 NOTE — Telephone Encounter (Signed)
Please call him and reassure that he has no active Hepatitis  A because the Hep A IgM was negative. He had a past infection - which is common in the adult population and does not give any permament liver problems. We can discuss at next office.

## 2019-08-02 ENCOUNTER — Other Ambulatory Visit: Payer: Self-pay

## 2019-08-02 ENCOUNTER — Ambulatory Visit
Admission: RE | Admit: 2019-08-02 | Discharge: 2019-08-02 | Disposition: A | Discharge status: Home / Self Care | Payer: BC Managed Care – PPO | Source: Ambulatory Visit | Attending: Nurse Practitioner | Admitting: Nurse Practitioner

## 2019-08-02 DIAGNOSIS — K7689 Other specified diseases of liver: Secondary | ICD-10-CM | POA: Diagnosis not present

## 2019-08-02 DIAGNOSIS — Q600 Renal agenesis, unilateral: Secondary | ICD-10-CM | POA: Diagnosis not present

## 2019-08-02 DIAGNOSIS — K760 Fatty (change of) liver, not elsewhere classified: Secondary | ICD-10-CM | POA: Diagnosis not present

## 2019-08-02 DIAGNOSIS — R748 Abnormal levels of other serum enzymes: Secondary | ICD-10-CM | POA: Diagnosis not present

## 2019-08-02 DIAGNOSIS — R7989 Other specified abnormal findings of blood chemistry: Secondary | ICD-10-CM | POA: Diagnosis not present

## 2019-08-03 NOTE — Addendum Note (Signed)
Addended by: Amedeo Kinsman A on: 08/03/2019 07:16 PM   Modules accepted: Orders

## 2019-08-06 ENCOUNTER — Ambulatory Visit (INDEPENDENT_AMBULATORY_CARE_PROVIDER_SITE_OTHER): Payer: BC Managed Care – PPO | Admitting: Licensed Clinical Social Worker

## 2019-08-06 ENCOUNTER — Other Ambulatory Visit: Payer: Self-pay

## 2019-08-06 ENCOUNTER — Encounter: Payer: Self-pay | Admitting: Licensed Clinical Social Worker

## 2019-08-06 DIAGNOSIS — Z789 Other specified health status: Secondary | ICD-10-CM

## 2019-08-06 DIAGNOSIS — F331 Major depressive disorder, recurrent, moderate: Secondary | ICD-10-CM

## 2019-08-06 DIAGNOSIS — Z7289 Other problems related to lifestyle: Secondary | ICD-10-CM
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
## 2019-08-07 ENCOUNTER — Telehealth: Payer: Self-pay | Admitting: Nurse Practitioner

## 2019-08-07 NOTE — Telephone Encounter (Signed)
I called pt to sch MRI pt states he wants to hold until his appt with  Provider on 08/13/2019. I advised pt that the auth is valid until 02/02/2020.

## 2019-08-13 ENCOUNTER — Ambulatory Visit: Payer: BC Managed Care – PPO | Admitting: Nurse Practitioner

## 2019-08-13 ENCOUNTER — Encounter: Payer: Self-pay | Admitting: Nurse Practitioner

## 2019-08-13 ENCOUNTER — Other Ambulatory Visit: Payer: Self-pay

## 2019-08-13 VITALS — BP 110/70 | HR 79 | Temp 98.7°F | Ht 74.0 in | Wt 208.0 lb

## 2019-08-13 DIAGNOSIS — R7401 Elevation of levels of liver transaminase levels: Secondary | ICD-10-CM

## 2019-08-13 DIAGNOSIS — Z7289 Other problems related to lifestyle: Secondary | ICD-10-CM | POA: Diagnosis not present

## 2019-08-13 DIAGNOSIS — Z789 Other specified health status: Secondary | ICD-10-CM

## 2019-08-13 DIAGNOSIS — E782 Mixed hyperlipidemia: Secondary | ICD-10-CM

## 2019-08-13 DIAGNOSIS — Z720 Tobacco use: Secondary | ICD-10-CM

## 2019-08-13 DIAGNOSIS — F1090 Alcohol use, unspecified, uncomplicated: Secondary | ICD-10-CM

## 2019-08-13 DIAGNOSIS — K760 Fatty (change of) liver, not elsewhere classified: Secondary | ICD-10-CM

## 2019-08-13 DIAGNOSIS — F339 Major depressive disorder, recurrent, unspecified: Secondary | ICD-10-CM

## 2019-08-13 DIAGNOSIS — F331 Major depressive disorder, recurrent, moderate: Secondary | ICD-10-CM

## 2019-08-13 MED ORDER — BUPROPION HCL ER (XL) 150 MG PO TB24
150.0000 mg | ORAL_TABLET | Freq: Every day | ORAL | 1 refills | Status: DC
Start: 2019-08-13 — End: 2019-09-25
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
## 2019-08-13 NOTE — Patient Instructions (Addendum)
I will discuss with Psychiatry a medication  for you if we stop sertraline.  Please cut back on alcohol to no more than 2 beverages per day.   Continue working with your psychologist.   Supporting Someone With Depression Depression is a mental health condition that affects the way a person feels, thinks, and handles daily activities such as eating, sleeping, and working. When a person has depression, his or her condition can affect others around him or her, such as friends and family members. Friends and family can help by offering support and understanding. What do I need to know about this condition? The main symptoms of depression are:  Constant depressed or irritable mood.  Loss of interest in things and activities that were enjoyed in the past. Other symptoms of depression include:  Fatigue.  Sleeping too much or too little.  Difficulty falling asleep, or waking up early and not being able to get back to sleep.  Difficulty concentrating or making decisions.  Changes in appetite and weight.  Staying away from others (isolating oneself).  Expressing feelings of guilt.  Expressing suicidal thoughts or feelings. What do I need to know about the treatment options? This condition is usually treated by mental health providers such as psychologists, psychiatrists, and clinical social workers. Treatment may include one or more of the following:  Psychotherapy, also called talk therapy or counseling. Types of psychotherapy include individual or group therapy, and they usually involve the following approaches: ? Cognitive behavioral therapy (CBT). This type of therapy teaches a person how to recognize feelings, thoughts, and behaviors that contribute to depression. The person is taught to make a choice about how to respond to these feelings, thoughts, and behaviors so that he or she can experience fewer symptoms. ? Interpersonal therapy (IPT). This helps to improve the way that  someone with depression relates to and communicates with others. This type of therapy may involve caregivers and loved ones. ? Family therapy. This treatment helps family members to communicate and deal with conflict in healthy ways.  Medicine. This is often used to help with certain emotions and behaviors. Combining medicine and therapy is often the most effective approach. The following lifestyle changes may also help with managing symptoms of depression:  Limiting alcohol and drug use.  Exercising regularly.  Getting enough good-quality sleep.  Making healthy eating choices.  Reducing distressing situations.  Spending time outside.  Following regular daily routines. How can I support my loved one? Talk about the condition Good communication is the key to supporting your friend or family member. Here are a few things to keep in mind:  Ask your loved one how you can support him or her.  Respect your loved one's right to make decisions.  Be careful about too much prodding. Try not to overdo reminders to an adult friend or family member about things like taking medicines. Ask how your loved one prefers that you help.  Be encouraging and offer emotional support. This can help to lower stress. Even saying something simple to comfort your loved one may help.  Never ignore comments about suicide, and do not try to avoid the subject of suicide. Talking about suicide will not make your loved one want to act on it. You or your loved one can reach out 24 hours a day to get free, private support (on the phone or a live online chat) from a suicide crisis helpline, such as the National Suicide Prevention Lifeline at (910)122-1063.  Listening is very  important. Be available if your friend or family member wants to talk. Make an effort to acknowledge his or her feelings and stay calm and realistic. Find support and resources A health care provider may be able to recommend mental health  resources that are available online or over the phone. You could start with:  Government sites such as the Substance Abuse and Mental Health Services Administration (SAMHSA): SkateOasis.com.pt  National mental health organizations such as the [employer]ness (NAMI): www.nami.org You may also consider:  Joining self-help and support groups, not only for your friend or family member, but also for yourself. People in these peer and family support groups understand what you and your loved one are going through. They can help you feel a sense of hope and connect you with local resources to help you learn more.  Attending family therapy with your loved one. General support  Make an effort to learn all you can about depression.  Help your loved one follow his or her treatment plan as directed by health care providers. This could mean driving him or her to therapy sessions or suggesting ways to cope with stress.  Ask your loved one if you may join him or her for a therapy session or go with him or her to health care visits. Joining your loved one with his or her permission can give you an opportunity to learn how to be more supportive.  Include your loved one in activities. Invite her or him to go for walks and outings. At first, your loved one may not want to, but keep trying.  Be patient and do not expect your loved one to do too much too soon.  Help with daily responsibilities, such as laundry or meals. Sometimes daily tasks seem overwhelming to a person with depression.  Remember that your support really matters. Social support is a huge benefit for someone who is coping with depression. How can I create a safe environment? If your loved one feels unable to control his or her behavior, it may be necessary to take steps to keep his or her home safe. Such steps may include:  Locking up alcohol and prescription pills that your loved one may turn to. Count prescription pills  often. You may want to consider removing alcohol from the home.  Removing or locking up guns and other weapons. If you do not have a safe place to keep a gun, local law enforcement may store a gun for you.  Making a written crisis plan. Include important phone numbers, such as the local crisis intervention team. Make sure that: ? The person with depression knows about this plan. ? Everyone who has regular contact with that person knows about the plan and knows what to do in an emergency. How should I care for myself? It is important to find ways to care for your body, mind, and well-being while supporting someone with depression.  Spend time with friends and family. Find someone you can talk to who will also help you work on using coping skills to manage stress. Consider seeking therapy for yourself.  Try to maintain your normal routines. This can help you remember that your life is about more than your loved one's condition.  Understand what your limits are. Say "no" to requests or events that lead to a schedule that is too busy.  Make time for activities that help you relax, and try to not feel guilty about taking time for yourself.  Consider  trying meditation and deep breathing exercises to lower stress.  Get plenty of sleep.  Exercise, even if it is just taking a short walk a few times a week. What are some signs that the condition is getting worse? Signs that your loved one's condition may be getting worse include:  Symptoms returning or getting worse.  Not taking medicines or attending therapy as prescribed.  Having more trouble sleeping or doing everyday activities.  Withdrawal from friends and family. Get help right away if:  Your loved one expresses serious thoughts about self-harm or about hurting others.  Your loved one sees, hears, tastes, smells, or feels things that are not present (hallucinations). If you ever feel like your loved one may hurt himself or herself  or others, or may have thoughts about taking his or her own life, get help right away. You can go to your nearest emergency department or call:  Your local emergency services (911 in the [place]  A suicide crisis helpline, such as the National Suicide Prevention Lifeline at (940)788-8255. This is open 24 hours a day. Summary  Depression is a mood disorder that affects the way a person feels, thinks, and handles daily activities.  Depression is usually treated by mental health professionals. It may include psychotherapy, medicine, lifestyle changes, or a combination of these approaches.  When you support a loved one with depression, it is important to keep yourself healthy and safe.  Get help right away if your loved one expresses serious thoughts about self-harm. This information is not intended to replace advice given to you by your health care provider. Make sure you discuss any questions you have with your health care provider. Document Revised: 04/20/2018 Document Reviewed: 05/11/2016 Elsevier Patient Education  2020 [employer].

## 2019-08-15 ENCOUNTER — Encounter: Payer: Self-pay | Admitting: Nurse Practitioner

## 2019-08-15 DIAGNOSIS — K760 Fatty (change of) liver, not elsewhere classified: Secondary | ICD-10-CM | POA: Insufficient documentation

## 2019-08-15 DIAGNOSIS — Z789 Other specified health status: Secondary | ICD-10-CM | POA: Insufficient documentation

## 2019-08-15 DIAGNOSIS — E782 Mixed hyperlipidemia: Secondary | ICD-10-CM | POA: Insufficient documentation

## 2019-08-15 DIAGNOSIS — R7401 Elevation of levels of liver transaminase levels: Secondary | ICD-10-CM | POA: Insufficient documentation

## 2019-08-15 DIAGNOSIS — Z7289 Other problems related to lifestyle: Secondary | ICD-10-CM | POA: Insufficient documentation

## 2019-08-15 NOTE — Assessment & Plan Note (Signed)
Cut back to eventually off alcohol. US shows FL and indeterminant hypoechoic area adjacent to the gallbladder fossa which radiology recommends MRI.  Patient is considering.  He has immunity to hep AMB.  Normal iron, ANA, mitochondrial and smooth muscle antibody, and markedly elevated lipids.

## 2019-08-15 NOTE — Assessment & Plan Note (Signed)
Lifestyle management  and when liver is sorted- consider crestor
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
## 2019-08-16 ENCOUNTER — Telehealth: Payer: Self-pay | Admitting: Nurse Practitioner

## 2019-08-16 NOTE — Telephone Encounter (Signed)
lft vm for pt to call ofc to sch MRI. And or to follow up if he wanted to have it done.

## 2019-08-20 ENCOUNTER — Other Ambulatory Visit: Payer: Self-pay

## 2019-08-20 ENCOUNTER — Ambulatory Visit (INDEPENDENT_AMBULATORY_CARE_PROVIDER_SITE_OTHER): Payer: BC Managed Care – PPO | Admitting: Licensed Clinical Social Worker

## 2019-08-20 ENCOUNTER — Encounter: Payer: Self-pay | Admitting: Licensed Clinical Social Worker

## 2019-08-20 DIAGNOSIS — F331 Major depressive disorder, recurrent, moderate: Secondary | ICD-10-CM

## 2019-08-20 NOTE — Progress Notes (Signed)
Patient Location: Home  Provider Location: Home Office   Virtual Visit via Video Note  I connected with [name] on 08/20/19 at  9:00 AM EDT by a video enabled telemedicine application and verified that I am speaking with the correct person using two identifiers.   I discussed the limitations of evaluation and management by telemedicine and the availability of in person appointments. The patient expressed understanding and agreed to proceed.  THERAPY PROGRESS NOTE  Session Time: 30 Minutes  Participation Level: Active  Behavioral Response: CasualAlertDepressed  Type of Therapy: Individual Therapy  Treatment Goals addressed: Communication: Asserting Needs and Coping  Interventions: CBT  Summary: Wayne [name] is a 39 y.o. male who presents with depression sxs. Pt identified current stressors related to work and family. Pt reported "I am not very open or good at talking about stuff that is on my mind and that frustrates" his spouse. Pt also described stress at work related to managing others. Pt reported that he has recently been switched from Zoloft to Wellbutrin and is not yet sure of its effectiveness in past 5 days. Pt described ruminations and sleep issues that he would like to resolve with assistance of medication and behavioral strategies.  Suicidal/Homicidal: No  Therapist Response: Therapist met with patient for first session since completing CCA. Therapist and patient reviewed treatment plan and goals for treatment. Pt in agreement. Therapist and patient discussed current stressors and impact on patient functioning. Therapist provided psychoeducation on using the check-in method for communicating with spouse and sleep hygiene. Pt was receptive.  Plan: Return again in 2 weeks.  Diagnosis: Axis I: MDD, Recurrent, Moderate    Axis II: N/A  [name], LCSW, LCAS 08/20/2019

## 2019-08-24 ENCOUNTER — Telehealth: Payer: Self-pay | Admitting: Nurse Practitioner

## 2019-08-24 NOTE — Telephone Encounter (Signed)
lft vm following up with pt about his MRI to sch.

## 2019-08-31 ENCOUNTER — Telehealth: Payer: Self-pay | Admitting: Nurse Practitioner

## 2019-08-31 NOTE — Telephone Encounter (Signed)
lft vm for pt to call ofc to follow up on MRI

## 2019-09-02 ENCOUNTER — Encounter: Payer: Self-pay | Admitting: Nurse Practitioner

## 2019-09-03 ENCOUNTER — Other Ambulatory Visit: Payer: Self-pay

## 2019-09-03 ENCOUNTER — Telehealth: Payer: Self-pay | Admitting: Licensed Clinical Social Worker

## 2019-09-03 ENCOUNTER — Ambulatory Visit: Payer: BC Managed Care – PPO | Admitting: Licensed Clinical Social Worker

## 2019-09-03 NOTE — Telephone Encounter (Signed)
He needs to be seen today in an Acute Care if his dentist cannot see him today.

## 2019-09-03 NOTE — Telephone Encounter (Signed)
Therapist sent video chat link to patient's phone and email for today's scheduled session. Pt responded and s/ that he was having problems with his teeth and currently at the dentist to try to get seen. Pt reported he thinks this may be an infection caused by dry mouth and receding gums that may be a side effect of his medication. Pt session was canceled. Pt will be seen at next appointment on 09/18/19. Pt also offered option to call in to reschedule for a sooner appointment if needed.

## 2019-09-18 ENCOUNTER — Other Ambulatory Visit: Payer: Self-pay

## 2019-09-18 ENCOUNTER — Ambulatory Visit (INDEPENDENT_AMBULATORY_CARE_PROVIDER_SITE_OTHER): Payer: BC Managed Care – PPO | Admitting: Licensed Clinical Social Worker

## 2019-09-18 ENCOUNTER — Encounter: Payer: Self-pay | Admitting: Licensed Clinical Social Worker

## 2019-09-18 DIAGNOSIS — F331 Major depressive disorder, recurrent, moderate: Secondary | ICD-10-CM

## 2019-09-18 NOTE — Progress Notes (Signed)
Patient Location: Advertising account executive Location: Home Office   Virtual Visit via Video Note  I connected with Barack Nicodemus Simer on 09/18/19 at  9:00 AM EDT by a video enabled telemedicine application and verified that I am speaking with the correct person using two identifiers.   I discussed the limitations of evaluation and management by telemedicine and the availability of in person appointments. The patient expressed understanding and agreed to proceed.  THERAPY PROGRESS NOTE  Session Time: 57 Minutes  Participation Level: Active  Behavioral Response: Well GroomedAlertDepressed  Type of Therapy: Individual Therapy  Treatment Goals addressed: Communication: Relationship/Family Dynamics and Coping  Interventions: CBT  Summary: Cashius Grandstaff Sockwell is a 39 y.o. male who presents with depression sxs. Pt reported due to complications from his medications that resulted in dental issues he has stopped taking all psychotropics prescribed by his physician. Pt reported that he can tell the difference in mood and thoughts when he is on vs not taking the medication. Pt reported that he is in communication with prescriber and wants to find the right combination/dosage of medication to help reduce "spiraling" thoughts and improve mood. Pt reported that he is working on communicating his feelings to spouse. Pt acknowledged that this has been a challenge due to not having great role models in his own family. Pt identified positive habits he would like to make routine.  Suicidal/Homicidal: No  Therapist Response: Therapist met with patient for follow up session. Therapist and patient reviewed homework from last session. Therapist and patient discussed sxs, hx of family dynamics and coping skills. Therapist provided psychoeducation around habit formation. Pt was receptive.  Plan: Return again in 2 weeks.  Diagnosis: Axis I: MDD, Recurrent, Moderate    Axis II: N/A    [name], LCSW,  LCAS 09/18/2019

## 2019-09-19 NOTE — Telephone Encounter (Signed)
Patient is scheduled to come in on 09/24/19 at 8:30 am

## 2019-09-24 ENCOUNTER — Other Ambulatory Visit: Payer: Self-pay

## 2019-09-24 ENCOUNTER — Encounter: Payer: Self-pay | Admitting: Nurse Practitioner

## 2019-09-24 ENCOUNTER — Ambulatory Visit: Payer: BC Managed Care – PPO | Admitting: Nurse Practitioner

## 2019-09-24 VITALS — BP 106/76 | HR 84 | Temp 98.4°F | Ht 74.0 in | Wt 205.2 lb

## 2019-09-24 DIAGNOSIS — R7989 Other specified abnormal findings of blood chemistry: Secondary | ICD-10-CM

## 2019-09-24 DIAGNOSIS — F339 Major depressive disorder, recurrent, unspecified: Secondary | ICD-10-CM

## 2019-09-24 DIAGNOSIS — K760 Fatty (change of) liver, not elsewhere classified: Secondary | ICD-10-CM

## 2019-09-24 DIAGNOSIS — Z720 Tobacco use: Secondary | ICD-10-CM

## 2019-09-24 DIAGNOSIS — R7401 Elevation of levels of liver transaminase levels: Secondary | ICD-10-CM

## 2019-09-24 DIAGNOSIS — Z7289 Other problems related to lifestyle: Secondary | ICD-10-CM

## 2019-09-24 DIAGNOSIS — F1090 Alcohol use, unspecified, uncomplicated: Secondary | ICD-10-CM

## 2019-09-24 DIAGNOSIS — F411 Generalized anxiety disorder: Secondary | ICD-10-CM | POA: Diagnosis not present

## 2019-09-24 DIAGNOSIS — Z789 Other specified health status: Secondary | ICD-10-CM

## 2019-09-24 NOTE — Progress Notes (Signed)
Established Patient Office Visit  Subjective:  Patient ID: Wayne [name], male    DOB: 31-Jan-1980  Age: 39 y.o. MRN: [id number]  CC:  Chief Complaint  Patient presents with  . Medication Management    HPI Wayne [name] is a 39 year old patient who establish care 07/12/2019 for depression, anxiety, and he was placed on Zoloft, psychiatry referral and advised to cut back on alcohol use.  He had elevated LFTs, ultrasound showed fatty liver with an ill-defined hypoechoic area adjacent to the gallbladder fossa which most likely represents fatty sparing, but is indeterminate and radiology recommended liver MRI for further evaluation.  Patient has declined MRI study.   Follow-up on 08/13/2019 reported improvement on sertraline 50 mg daily, he was working with a Tourist information centre manager.  He had sexual dysfunction with sertraline and it was discontinued.  He was started on Wellbutrin. He was found to have hyperlipidemia, and ASCVD the risk is 50% lifetime.  Advised to begin statin and declines.  He is in favor of lifestyle management.  Anxiety depression: He was unable to tolerate Wellbutrin as it gave him a severe dry mouth and he discontinued the Wellbutrin on 08/27/2019.  He has been seeing a dentist and reports he has gum recession and is being treated.  He is currently not on medication. He has a behavioral therapist.  He is agreeable to psychiatric evaluation to help with medication management.  He is exercising, spending time outdoors, sleeping well, and believes that he is managing his mental health well enough at this time.  His PHQ-9:  22 and GAD-7 :16.  No SI or HI.  Low Vit D: Vit D: Advised to begin vitamin D3 1000 IU daily  Elevated ALT/FL/ETOH: His ALT has improved.  He has markedly cut back on his alcohol consumption.  No right upper quadrant pain or tenderness.  We reviewed his ultrasound he can with focal fatty sparing and request for MRI.  Patient declines.  He declines AFP test.  He  declines ultrasound.  I offered referral to GI and  he would like to continue with lifestyle changes, and monitor the liver panel. Lab Results  Component Value Date   ALT 75 (H) 07/24/2019   AST 35 07/24/2019   ALKPHOS 84 07/24/2019   BILITOT 0.3 07/24/2019    HLD: Very elevated lipid panel with elevated ASCVD risk.  Patient does not want to start statin even though it is recommended with his fatty liver with close monitoring.  We discussed the benefits of statin therapy in addition to simply lowering his lab values.  He reports he has been eating a terrible diet not exercising at all and he is changing that.  He wants to work on exercise and weight loss and diet changes and reevaluate in 6 months. If his lipid panel is still elevated in 6 months, he would be agreeable to beginning statin at that time.   Lab Results  Component Value Date   CHOL 224 (H) 07/12/2019   HDL 38 (L) 07/12/2019   LDLCALC 155 (H) 07/12/2019   TRIG 168 (H) 07/12/2019   CHOLHDL 5.9 (H) 07/12/2019   Tobacco: smokes 1/2 ppd since age 39 yo. He has  tried to quit 4-5 times and off 1- 6 mos .  We discussed Chantix, and to stop nicotine replacement with patch and chewing gum.  He declines both and will try this on his own.  He was counseled for 5 minutes or more and given the quit line  [place] information.   Past Medical History:  Diagnosis Date  . Chicken pox   . Depression   . GERD (gastroesophageal reflux disease)   . History of alcohol abuse     Past Surgical History:  Procedure Laterality Date  . NO PAST SURGERIES      Family History  Problem Relation Age of Onset  . Diabetes Mother   . Hypertension Mother   . Alcohol abuse Mother   . Cancer Paternal Aunt        lung  . Stroke Maternal Grandmother   . Diabetes Maternal Grandmother   . Heart disease Paternal Grandfather     Social History   Socioeconomic History  . Marital status: Married    Spouse name: Not on file  . Number of children: Not on  file  . Years of education: Not on file  . Highest education level: Not on file  Occupational History  . Occupation: medical lab support services     Employer: [employer]  Tobacco Use  . Smoking status: Current Every Day Smoker    Packs/day: 0.50    Years: 20.00    Pack years: 10.00  . Smokeless tobacco: Never Used  Substance and Sexual Activity  . Alcohol use: Yes    Comment: 2-3 times a week 5-6 shots of liqour  . Drug use: Yes    Types: Marijuana  . Sexual activity: Yes    Birth control/protection: None  Other Topics Concern  . Not on file  Social History Narrative   Wife and kids x 2 ages 3318 DTR and 827 yo son   Social Determinants of Corporate investment bankerHealth   Financial Resource Strain:   . Difficulty of Paying Living Expenses: Not on file  Food Insecurity:   . Worried About Programme researcher, broadcasting/film/videounning Out of Food in the Last Year: Not on file  . Ran Out of Food in the Last Year: Not on file  Transportation Needs:   . Lack of Transportation (Medical): Not on file  . Lack of Transportation (Non-Medical): Not on file  Physical Activity:   . Days of Exercise per Week: Not on file  . Minutes of Exercise per Session: Not on file  Stress:   . Feeling of Stress : Not on file  Social Connections:   . Frequency of Communication with Friends and Family: Not on file  . Frequency of Social Gatherings with Friends and Family: Not on file  . Attends Religious Services: Not on file  . Active Member of Clubs or Organizations: Not on file  . Attends BankerClub or Organization Meetings: Not on file  . Marital Status: Not on file  Intimate Partner Violence:   . Fear of Current or Ex-Partner: Not on file  . Emotionally Abused: Not on file  . Physically Abused: Not on file  . Sexually Abused: Not on file    Outpatient Medications Prior to Visit  Medication Sig Dispense Refill  . buPROPion (WELLBUTRIN XL) 150 MG 24 hr tablet Take 1 tablet (150 mg total) by mouth daily. Take it in the morning. (Patient not taking: Reported on  08/15/2019) 30 tablet 1   No facility-administered medications prior to visit.    No Known Allergies  Review of Systems Pertinent positives in history of present illness otherwise negative   Objective:    Physical Exam Vitals reviewed.  HENT:     Head: Normocephalic and atraumatic.  Eyes:     Conjunctiva/sclera: Conjunctivae normal.     Pupils: Pupils are equal, round,  and reactive to light.  Cardiovascular:     Rate and Rhythm: Normal rate and regular rhythm.     Pulses: Normal pulses.     Heart sounds: Normal heart sounds.  Pulmonary:     Effort: Pulmonary effort is normal.     Breath sounds: Normal breath sounds.  Abdominal:     Palpations: Abdomen is soft.     Tenderness: There is no abdominal tenderness.  Musculoskeletal:        General: Normal range of motion.     Cervical back: Normal range of motion and neck supple.  Skin:    General: Skin is warm and dry.  Neurological:     General: No focal deficit present.     Mental Status: He is alert and oriented to person, place, and time.  Psychiatric:        Mood and Affect: Mood normal.        Behavior: Behavior normal.        Thought Content: Thought content normal.        Judgment: Judgment normal.     BP 106/76   Pulse 84   Temp 98.4 F (36.9 C) (Oral)   Ht 6\' 2"  (1.88 m)   Wt 205 lb 3.2 oz (93.1 kg)   SpO2 98%   BMI 26.35 kg/m  Wt Readings from Last 3 Encounters:  09/24/19 205 lb 3.2 oz (93.1 kg)  08/13/19 (!) 208 lb (94.3 kg)  07/12/19 216 lb 3.2 oz (98.1 kg)   Pulse Readings from Last 3 Encounters:  09/24/19 84  08/13/19 79  07/12/19 87    BP Readings from Last 3 Encounters:  09/24/19 106/76  08/13/19 110/70  07/12/19 116/80    Lab Results  Component Value Date   CHOL 224 (H) 07/12/2019   HDL 38 (L) 07/12/2019   LDLCALC 155 (H) 07/12/2019   TRIG 168 (H) 07/12/2019   CHOLHDL 5.9 (H) 07/12/2019      Health Maintenance Due  Topic Date Due  . COVID-19 Vaccine (1) Never done  . HIV  Screening  Never done  . TETANUS/TDAP  Never done  . INFLUENZA VACCINE  Never done    There are no preventive care reminders to display for this patient.  Lab Results  Component Value Date   TSH 0.988 07/12/2019   Lab Results  Component Value Date   WBC 9.6 07/12/2019   HGB 15.7 07/12/2019   HCT 46.5 07/12/2019   MCV 89 07/12/2019   PLT 307 07/12/2019   Lab Results  Component Value Date   NA 141 07/12/2019   K 4.4 07/12/2019   CO2 22 07/12/2019   GLUCOSE 76 07/12/2019   BUN 12 07/12/2019   CREATININE 1.04 07/12/2019   BILITOT 0.3 07/24/2019   ALKPHOS 84 07/24/2019   AST 35 07/24/2019   ALT 75 (H) 07/24/2019   PROT 7.0 07/24/2019   ALBUMIN 4.8 07/24/2019   CALCIUM 10.1 07/12/2019   Lab Results  Component Value Date   CHOL 224 (H) 07/12/2019   Lab Results  Component Value Date   HDL 38 (L) 07/12/2019   Lab Results  Component Value Date   LDLCALC 155 (H) 07/12/2019   Lab Results  Component Value Date   TRIG 168 (H) 07/12/2019   Lab Results  Component Value Date   CHOLHDL 5.9 (H) 07/12/2019   Lab Results  Component Value Date   HGBA1C 5.2 07/12/2019      Assessment & Plan:   Problem List  Items Addressed This Visit      Digestive   Hepatic steatosis     Other   Depression, recurrent (HCC) - Primary   Relevant Orders   Ambulatory referral to Psychiatry   Tobacco abuse   Alcohol use   Relevant Orders   Ambulatory referral to Psychiatry   Elevated ALT measurement   GAD (generalized anxiety disorder)   Relevant Orders   Ambulatory referral to Psychiatry   Low vitamin D level      No orders of the defined types were placed in this encounter. Patient advised:  I have placed referral into psychiatry to help with medication management for depression and anxiety.  We discussed alcohol cessation and you are making progress with cutting back alcohol use.  If you need some assistance please contact resources,  [facility]- [facility] 8- 3  Walk in -  [phone] for addiction help and counseling.   Another local run group in town for addiction services is [facility] and they have office hours Monday through Friday from 9-4 in person [phone]   There are local treatment centers for addiction: 1.  [facility] has outpatient and residential care in [place] [phone] 2.  [facility], [facility] [phone] 3.  [facility] [phone]  Your vitamin D level was low and I would recommend vitamin D3 1000 IU daily.  You can purchase this over-the-counter.  We discussed smoking cessation.  Here  is information on Quit line [place]  We discussed elevated cholesterol panel and he reports he can make dietary changes, weight loss, and increased exercise.  We can recheck a fasting lipid panel in 6 months.  We discussed elevated ALT, abnormal abdominal ultrasound with radiology requesting MRI to explore the one area beside the gallbladder change in texture.  The request for MRI is  to make sure that there is not a mass in this location.  We discussed getting a blood test called alpha-fetoprotein that is the liver tumor marker to look for malignancy.  You declined at this time and would like to continue to follow with lifestyle changes.    Repeat laboratory studies in 3 months with your liver panel, to monitor the liver labs.  Follow-up: No follow-ups on file.   This visit occurred during the SARS-CoV-2 public health emergency.  Safety protocols were in place, including screening questions prior to the visit, additional usage of staff PPE, and extensive cleaning of exam room while observing appropriate contact time as indicated for disinfecting solutions.   Amedeo Kinsman, NP

## 2019-09-24 NOTE — Patient Instructions (Addendum)
I have placed referral into psychiatry to help with medication management for depression and anxiety.  We discussed alcohol cessation and you are making progress with cutting back alcohol use.  If you need some assistance please contact resources,  [facility]- F 8- 3  Walk in - [phone] for addiction help and counseling.   Another local run group in town for addiction services is [facility] and they have office hours Monday through Friday from 9-4 in person [phone]   There are local treatment centers for addiction: 1.  [facility] has outpatient and residential care in [place] 1 [phone] 2.  [facility], [facility] [phone] 3.  [facility] [phone]  Your vitamin D level was low and I would recommend vitamin D3 1000 IU daily.  You can purchase this over-the-counter.  We discussed smoking cessation.  Here  is information on quit line [place]  We discussed elevated cholesterol panel and he reports he can make dietary changes, weight loss, and increased exercise.  We can recheck a fasting lipid panel in 6 months.  We discussed elevated ALT, abnormal abdominal ultrasound with radiology requesting MRI to explore the one area beside the gallbladder change in texture.  The request for MRI is  to make sure that there is not a mass in this location.  We discussed getting a blood test called alpha-fetoprotein that is the liver tumor marker to look for malignancy.  You declined at this time and would like to continue to follow with lifestyle changes.    Repeat laboratory studies in 3 months with your liver panel, to monitor the liver labs.   Fatty Liver Disease  Fatty liver disease occurs when too much fat has built up in your liver cells. Fatty liver disease is also called hepatic steatosis or steatohepatitis. The liver removes harmful substances from your bloodstream and produces fluids that your body needs. It also helps  your body use and store energy from the food you eat. In many cases, fatty liver disease does not cause symptoms or problems. It is often diagnosed when tests are being done for other reasons. However, over time, fatty liver can cause inflammation that may lead to more serious liver problems, such as scarring of the liver (cirrhosis) and liver failure. Fatty liver is associated with insulin resistance, increased body fat, high blood pressure (hypertension), and high cholesterol. These are features of metabolic syndrome and increase your risk for stroke, diabetes, and heart disease. What are the causes? This condition may be caused by:  Drinking too much alcohol.  Poor nutrition.  Obesity.  Cushing's syndrome.  Diabetes.  High cholesterol.  Certain drugs.  Poisons.  Some viral infections.  Pregnancy. What increases the risk? You are more likely to develop this condition if you:  Abuse alcohol.  Are overweight.  Have diabetes.  Have hepatitis.  Have a high triglyceride level.  Are pregnant. What are the signs or symptoms? Fatty liver disease often does not cause symptoms. If symptoms do develop, they can include:  Fatigue.  Weakness.  Weight loss.  Confusion.  Abdominal pain.  Nausea and vomiting.  Yellowing of your skin and the white parts of your eyes (jaundice).  Itchy skin. How is this diagnosed? This condition may be diagnosed by:  A physical exam and medical history.  Blood tests.  Imaging tests, such as an ultrasound, CT scan, or MRI.  A liver biopsy. A small sample of liver tissue is removed using a needle. The  sample is then looked at under a microscope. How is this treated? Fatty liver disease is often caused by other health conditions. Treatment for fatty liver may involve medicines and lifestyle changes to manage conditions such as:  Alcoholism.  High cholesterol.  Diabetes.  Being overweight or obese. Follow these instructions at  home:   Do not drink alcohol. If you have trouble quitting, ask your health care provider how to safely quit with the help of medicine or a supervised program. This is important to keep your condition from getting worse.  Eat a healthy diet as told by your health care provider. Ask your health care provider about working with a diet and nutrition specialist (dietitian) to develop an eating plan.  Exercise regularly. This can help you lose weight and control your cholesterol and diabetes. Talk to your health care provider about an exercise plan and which activities are best for you.  Take over-the-counter and prescription medicines only as told by your health care provider.  Keep all follow-up visits as told by your health care provider. This is important. Contact a health care provider if: You have trouble controlling your:  Blood sugar. This is especially important if you have diabetes.  Cholesterol.  Drinking of alcohol. Get help right away if:  You have abdominal pain.  You have jaundice.  You have nausea and vomiting.  You vomit blood or material that looks like coffee grounds.  You have stools that are black, tar-like, or bloody. Summary  Fatty liver disease develops when too much fat builds up in the cells of your liver.  Fatty liver disease often causes no symptoms or problems. However, over time, fatty liver can cause inflammation that may lead to more serious liver problems, such as scarring of the liver (cirrhosis).  You are more likely to develop this condition if you abuse alcohol, are pregnant, are overweight, have diabetes, have hepatitis, or have high triglyceride levels.  Contact your health care provider if you have trouble controlling your weight, blood sugar, cholesterol, or drinking of alcohol. This information is not intended to replace advice given to you by your health care provider. Make sure you discuss any questions you have with your health care  provider. Document Revised: 12/10/2016 Document Reviewed: 10/06/2016 Elsevier Patient Education  2020 [employer].  Steps to Quit Smoking Smoking tobacco is the leading cause of preventable death. It can affect almost every organ in the body. Smoking puts you and those around you at risk for developing many serious chronic diseases. Quitting smoking can be difficult, but it is one of the best things that you can do for your health. It is never too late to quit. How do I get ready to quit? When you decide to quit smoking, create a plan to help you succeed. Before you quit:  Pick a date to quit. Set a date within the next 2 weeks to give you time to prepare.  Write down the reasons why you are quitting. Keep this list in places where you will see it often.  Tell your family, friends, and co-workers that you are quitting. Support from your loved ones can make quitting easier.  Talk with your health care provider about your options for quitting smoking.  Find out what treatment options are covered by your health insurance.  Identify people, places, things, and activities that make you want to smoke (triggers). Avoid them. What first steps can I take to quit smoking?  Throw away all cigarettes at home,  at work, and in your car.  Throw away smoking accessories, such as Set designer.  Clean your car. Make sure to empty the ashtray.  Clean your home, including curtains and carpets. What strategies can I use to quit smoking? Talk with your health care provider about combining strategies, such as taking medicines while you are also receiving in-person counseling. Using these two strategies together makes you more likely to succeed in quitting than if you used either strategy on its own.  If you are pregnant or breastfeeding, talk with your health care provider about finding counseling or other support strategies to quit smoking. Do not take medicine to help you quit smoking unless  your health care provider tells you to do so. To quit smoking: Quit right away  Quit smoking completely, instead of gradually reducing how much you smoke over a period of time. Research shows that stopping smoking right away is more successful than gradually quitting.  Attend in-person counseling to help you build problem-solving skills. You are more likely to succeed in quitting if you attend counseling sessions regularly. Even short sessions of 10 minutes can be effective. Take medicine You may take medicines to help you quit smoking. Some medicines require a prescription and some you can purchase over-the-counter. Medicines may have nicotine in them to replace the nicotine in cigarettes. Medicines may:  Help to stop cravings.  Help to relieve withdrawal symptoms. Your health care provider may recommend:  Nicotine patches, gum, or lozenges.  Nicotine inhalers or sprays.  Non-nicotine medicine that is taken by mouth. Find resources Find resources and support systems that can help you to quit smoking and remain smoke-free after you quit. These resources are most helpful when you use them often. They include:  Online chats with a Veterinary surgeon.  Telephone quitlines.  Printed Materials engineer.  Support groups or group counseling.  Text messaging programs.  Mobile phone apps or applications. Use apps that can help you stick to your quit plan by providing reminders, tips, and encouragement. There are many free apps for mobile devices as well as websites. Examples include Quit Guide from the [employer] and smokefree.gov What things can I do to make it easier to quit?   Reach out to your family and friends for support and encouragement. Call telephone quitlines (1-800-QUIT-NOW), reach out to support groups, or work with a counselor for support.  Ask people who smoke to avoid smoking around you.  Avoid places that trigger you to smoke, such as bars, parties, or smoke-break areas at  work.  Spend time with people who do not smoke.  Lessen the stress in your life. Stress can be a smoking trigger for some people. To lessen stress, try: ? Exercising regularly. ? Doing deep-breathing exercises. ? Doing yoga. ? Meditating. ? Performing a body scan. This involves closing your eyes, scanning your body from head to toe, and noticing which parts of your body are particularly tense. Try to relax the muscles in those areas. How will I feel when I quit smoking? Day 1 to 3 weeks Within the first 24 hours of quitting smoking, you may start to feel withdrawal symptoms. These symptoms are usually most noticeable 2-3 days after quitting, but they usually do not last for more than 2-3 weeks. You may experience these symptoms:  Mood swings.  Restlessness, anxiety, or irritability.  Trouble concentrating.  Dizziness.  Strong cravings for sugary foods and nicotine.  Mild weight gain.  Constipation.  Nausea.  Coughing or a sore throat.  Changes in how the medicines that you take for unrelated issues work in your body.  Depression.  Trouble sleeping (insomnia). Week 3 and afterward After the first 2-3 weeks of quitting, you may start to notice more positive results, such as:  Improved sense of smell and taste.  Decreased coughing and sore throat.  Slower heart rate.  Lower blood pressure.  Clearer skin.  The ability to breathe more easily.  Fewer sick days. Quitting smoking can be very challenging. Do not get discouraged if you are not successful the first time. Some people need to make many attempts to quit before they achieve long-term success. Do your best to stick to your quit plan, and talk with your health care provider if you have any questions or concerns. Summary  Smoking tobacco is the leading cause of preventable death. Quitting smoking is one of the best things that you can do for your health.  When you decide to quit smoking, create a plan to help  you succeed.  Quit smoking right away, not slowly over a period of time.  When you start quitting, seek help from your health care provider, family, or friends. This information is not intended to replace advice given to you by your health care provider. Make sure you discuss any questions you have with your health care provider. Document Revised: 09/22/2018 Document Reviewed: 03/18/2018 Elsevier Patient Education  2020 Elsevier Inc.  Nonalcoholic Fatty Liver Disease Diet, Adult Nonalcoholic fatty liver disease is a condition that causes fat to build up in and around the liver. The disease makes it harder for the liver to work the way that it should. Following a healthy diet can help to keep nonalcoholic fatty liver disease under control. It can also help to prevent or improve conditions that are associated with the disease, such as heart disease, diabetes, high blood pressure, and abnormal cholesterol levels. Along with regular exercise, this diet:  Promotes weight loss.  Helps to control blood sugar levels.  Helps to improve the way that the body uses insulin. What are tips for following this plan? Reading food labels Always check food labels for:  The amount of saturated fat in a food. You should limit your intake of saturated fat. Saturated fat is found in foods that come from animals, including meat and dairy products such as butter, cheese, and whole milk.  The amount of fiber in a food. You should choose high-fiber foods such as fruits, vegetables, and whole grains. Try to get 25-30 grams (g) of fiber a day.  Cooking  When cooking, use heart-healthy oils that are high in monounsaturated fats. These include olive oil, canola oil, and avocado oil.  Limit frying or deep-frying foods. Cook foods using healthy methods such as baking, boiling, steaming, and grilling instead. Meal planning  You may want to keep track of how many calories you take in. Eating the right amount of  calories will help you achieve a healthy weight. Meeting with a registered dietitian can help you get started.  Limit how often you eat takeout and fast food. These foods are usually very high in fat, salt, and sugar.  Use the glycemic index (GI) to plan your meals. The index tells you how quickly a food will raise your blood sugar. Choose low-GI foods (GI less than 55). These foods take a longer time to raise blood sugar. A registered dietitian can help you identify foods lower on the GI scale. Lifestyle  You may want to follow a Mediterranean  diet. This diet includes a lot of vegetables, lean meats or fish, whole grains, fruits, and healthy oils and fats. What foods can I eat?  Fruits Bananas. Apples. Oranges. Grapes. Papaya. Mango. Pomegranate. Kiwi. Grapefruit. Cherries. Vegetables Lettuce. Spinach. Peas. Beets. Cauliflower. Cabbage. Broccoli. Carrots. Tomatoes. Squash. Eggplant. Herbs. Peppers. Onions. Cucumbers. Brussels sprouts. Yams and sweet potatoes. Beans. Lentils. Grains Whole wheat or whole-grain foods, including breads, crackers, cereals, and pasta. Stone-ground whole wheat. Unsweetened oatmeal. Bulgur. Barley. Quinoa. Brown or wild rice. Corn or whole wheat flour tortillas. Meats and other proteins Lean meats. Poultry. Tofu. Seafood and shellfish. Dairy Low-fat or fat-free dairy products, such as yogurt, cottage cheese, or cheese. Beverages Water. Sugar-free drinks. Tea. Coffee. Low-fat or skim milk. Milk alternatives, such as soy or almond milk. Real fruit juice. Fats and oils Avocado. Canola or olive oil. Nuts and nut butters. Seeds. Seasonings and condiments Mustard. Relish. Low-fat, low-sugar ketchup and barbecue sauce. Low-fat or fat-free mayonnaise. Sweets and desserts Sugar-free sweets. The items listed above may not be a complete list of foods and beverages you can eat. Contact a dietitian for more information. What foods should I limit or avoid? Meats and other  proteins Limit red meat to 1-2 times a week. Dairy [employer]. Fats and oils Palm oil and coconut oil. Fried foods. Other foods Processed foods. Foods that contain a lot of salt or sodium. Sweets and desserts Sweets that contain sugar. Beverages Sweetened drinks, such as sweet tea, milkshakes, iced sweet drinks, and sodas. Alcohol. The items listed above may not be a complete list of foods and beverages you should avoid. Contact a dietitian for more information. Where to find more information The General Shawn[place]ice [place] of Diabetes and Digestive and Kidney Diseases: [email or web address] Summary  Nonalcoholic fatty liver disease is a condition that causes fat to build up in and around the liver.  Following a healthy diet can help to keep nonalcoholic fatty liver disease under control. Your diet should be rich in fruits, vegetables, whole grains, and lean proteins.  Limit your intake of saturated fat. Saturated fat is found in foods that come from animals, including meat and dairy products such as butter, cheese, and whole milk.  This diet promotes weight loss, helps to control blood sugar levels, and helps to improve the way that the body uses insulin. This information is not intended to replace advice given to you by your health care provider. Make sure you discuss any questions you have with your health care provider. Document Revised: 04/21/2018 Document Reviewed: 01/19/2018 Elsevier Patient Education  2020 [employer].  Supporting Someone With Depression Depression is a mental health condition that affects the way a person feels, thinks, and handles daily activities such as eating, sleeping, and working. When a person has depression, his or her condition can affect others around him or her, such as friends and family members. Friends and family can help by offering support and understanding. What do I need to know about this condition? The main symptoms of depression are:  Constant  depressed or irritable mood.  Loss of interest in things and activities that were enjoyed in the past. Other symptoms of depression include:  Fatigue.  Sleeping too much or too little.  Difficulty falling asleep, or waking up early and not being able to get back to sleep.  Difficulty concentrating or making decisions.  Changes in appetite and weight.  Staying away from others (isolating oneself).  Expressing feelings of guilt.  Expressing suicidal thoughts or feelings. What do  I need to know about the treatment options? This condition is usually treated by mental health providers such as psychologists, psychiatrists, and clinical social workers. Treatment may include one or more of the following:  Psychotherapy, also called talk therapy or counseling. Types of psychotherapy include individual or group therapy, and they usually involve the following approaches: ? Cognitive behavioral therapy (CBT). This type of therapy teaches a person how to recognize feelings, thoughts, and behaviors that contribute to depression. The person is taught to make a choice about how to respond to these feelings, thoughts, and behaviors so that he or she can experience fewer symptoms. ? Interpersonal therapy (IPT). This helps to improve the way that someone with depression relates to and communicates with others. This type of therapy may involve caregivers and loved ones. ? Family therapy. This treatment helps family members to communicate and deal with conflict in healthy ways.  Medicine. This is often used to help with certain emotions and behaviors. Combining medicine and therapy is often the most effective approach. The following lifestyle changes may also help with managing symptoms of depression:  Limiting alcohol and drug use.  Exercising regularly.  Getting enough good-quality sleep.  Making healthy eating choices.  Reducing distressing situations.  Spending time outside.  Following  regular daily routines. How can I support my loved one? Talk about the condition Good communication is the key to supporting your friend or family member. Here are a few things to keep in mind:  Ask your loved one how you can support him or her.  Respect your loved one's right to make decisions.  Be careful about too much prodding. Try not to overdo reminders to an adult friend or family member about things like taking medicines. Ask how your loved one prefers that you help.  Be encouraging and offer emotional support. This can help to lower stress. Even saying something simple to comfort your loved one may help.  Never ignore comments about suicide, and do not try to avoid the subject of suicide. Talking about suicide will not make your loved one want to act on it. You or your loved one can reach out 24 hours a day to get free, private support (on the phone or a live online chat) from a suicide crisis helpline, such as the National Suicide Prevention Lifeline at 307-529-49521-(704)116-9276.  Listening is very important. Be available if your friend or family member wants to talk. Make an effort to acknowledge his or her feelings and stay calm and realistic. Find support and resources A health care provider may be able to recommend mental health resources that are available online or over the phone. You could start with:  Government sites such as the Substance Abuse and Mental Health Services Administration (SAMHSA): SkateOasis.com.ptwww.samhsa.gov  National mental health organizations such as the [employer] on Mental Illness (NAMI): www.nami.org You may also consider:  Joining self-help and support groups, not only for your friend or family member, but also for yourself. People in these peer and family support groups understand what you and your loved one are going through. They can help you feel a sense of hope and connect you with local resources to help you learn more.  Attending family therapy with your loved  one. General support  Make an effort to learn all you can about depression.  Help your loved one follow his or her treatment plan as directed by health care providers. This could mean driving him or her to therapy sessions or suggesting  ways to cope with stress.  Ask your loved one if you may join him or her for a therapy session or go with him or her to health care visits. Joining your loved one with his or her permission can give you an opportunity to learn how to be more supportive.  Include your loved one in activities. Invite her or him to go for walks and outings. At first, your loved one may not want to, but keep trying.  Be patient and do not expect your loved one to do too much too soon.  Help with daily responsibilities, such as laundry or meals. Sometimes daily tasks seem overwhelming to a person with depression.  Remember that your support really matters. Social support is a huge benefit for someone who is coping with depression. How can I create a safe environment? If your loved one feels unable to control his or her behavior, it may be necessary to take steps to keep his or her home safe. Such steps may include:  Locking up alcohol and prescription pills that your loved one may turn to. Count prescription pills often. You may want to consider removing alcohol from the home.  Removing or locking up guns and other weapons. If you do not have a safe place to keep a gun, local law enforcement may store a gun for you.  Making a written crisis plan. Include important phone numbers, such as the local crisis intervention team. Make sure that: ? The person with depression knows about this plan. ? Everyone who has regular contact with that person knows about the plan and knows what to do in an emergency. How should I care for myself? It is important to find ways to care for your body, mind, and well-being while supporting someone with depression.  Spend time with friends and family.  Find someone you can talk to who will also help you work on using coping skills to manage stress. Consider seeking therapy for yourself.  Try to maintain your normal routines. This can help you remember that your life is about more than your loved one's condition.  Understand what your limits are. Say "no" to requests or events that lead to a schedule that is too busy.  Make time for activities that help you relax, and try to not feel guilty about taking time for yourself.  Consider trying meditation and deep breathing exercises to lower stress.  Get plenty of sleep.  Exercise, even if it is just taking a short walk a few times a week. What are some signs that the condition is getting worse? Signs that your loved one's condition may be getting worse include:  Symptoms returning or getting worse.  Not taking medicines or attending therapy as prescribed.  Having more trouble sleeping or doing everyday activities.  Withdrawal from friends and family. Get help right away if:  Your loved one expresses serious thoughts about self-harm or about hurting others.  Your loved one sees, hears, tastes, smells, or feels things that are not present (hallucinations). If you ever feel like your loved one may hurt himself or herself or others, or may have thoughts about taking his or her own life, get help right away. You can go to your nearest emergency department or call:  Your local emergency services (911 in the [place]  A suicide crisis helpline, such as the National Suicide Prevention Lifeline at 503-681-7706. This is open 24 hours a day. Summary  Depression is a mood disorder that affects the  way a person feels, thinks, and handles daily activities.  Depression is usually treated by mental health professionals. It may include psychotherapy, medicine, lifestyle changes, or a combination of these approaches.  When you support a loved one with depression, it is important to keep yourself  healthy and safe.  Get help right away if your loved one expresses serious thoughts about self-harm. This information is not intended to replace advice given to you by your health care provider. Make sure you discuss any questions you have with your health care provider. Document Revised: 04/20/2018 Document Reviewed: 05/11/2016 Elsevier Patient Education  2020 [employer].

## 2019-09-25 ENCOUNTER — Encounter: Payer: Self-pay | Admitting: Nurse Practitioner

## 2019-09-25 NOTE — Telephone Encounter (Signed)
Yes, he can re start the Zoloft at 1/2 tab x 1 week and go to 1 tablet while he is waiting for Psych appt.     Let him know I am asking for an urgent psychiatric referral. He can call [facility] and see if they can help him sooner. He has the number on the AVS.

## 2019-09-28 ENCOUNTER — Telehealth: Payer: Self-pay | Admitting: Nurse Practitioner

## 2019-09-28 NOTE — Telephone Encounter (Signed)
Please call and ask if he was able to get int to [facility] or other mental health provider. How is he doing on Zoloft? I placed a referral in to Dr. Maryruth Bun if he can wait.

## 2019-10-01 ENCOUNTER — Other Ambulatory Visit: Payer: Self-pay

## 2019-10-01 ENCOUNTER — Encounter: Payer: Self-pay | Admitting: Licensed Clinical Social Worker

## 2019-10-01 ENCOUNTER — Ambulatory Visit (INDEPENDENT_AMBULATORY_CARE_PROVIDER_SITE_OTHER): Payer: BC Managed Care – PPO | Admitting: Licensed Clinical Social Worker

## 2019-10-01 DIAGNOSIS — F331 Major depressive disorder, recurrent, moderate: Secondary | ICD-10-CM | POA: Diagnosis not present

## 2019-10-01 DIAGNOSIS — F1010 Alcohol abuse, uncomplicated: Secondary | ICD-10-CM | POA: Diagnosis not present

## 2019-10-01 NOTE — Telephone Encounter (Signed)
LMCTB

## 2019-10-01 NOTE — Progress Notes (Signed)
Patient Location: Home  Provider Location: Home Office   Virtual Visit via Video Note  I connected with [name] on 10/01/19 at  9:00 AM EDT by a video enabled telemedicine application and verified that I am speaking with the correct person using two identifiers.   I discussed the limitations of evaluation and management by telemedicine and the availability of in person appointments. The patient expressed understanding and agreed to proceed.  THERAPY PROGRESS NOTE  Session Time: 62 Minutes  Participation Level: Active  Behavioral Response: CasualAlertDepressed  Type of Therapy: Individual Therapy  Treatment Goals addressed: Coping  Interventions: CBT  Summary: Wayne [name] is a 39 y.o. male who presents with depression sxs. Pt reported under his 56 care he has restarted taking Zoloft and has been referred to a psychiatrist for medication management. Pt reported continued improvement in communicating feelings to spouse and has been working on getting back into an exercise routine by hiking on weekends. Pt reported with the gap in taking medications he has relied on alcohol to self-medicate. Pt reported drinking 6-7 hard liquor drinks 3 days last week and identified triggers for drinking. Pt reported he usually only drinks when he is completely by himself and had a stressful day at work. Pt identified work related stressors and attempts to manage them.  Suicidal/Homicidal: No  Therapist Response: Therapist met with patient for follow up session. Therapist and patient explored drinking, work related stressors and weighed pros and cons of staying in current position. Pt was receptive.  Plan: Return again in 2 weeks.  Diagnosis: Axis I: MDD, Reccurent, Moderate    Axis II: N/A  [name], LCSW, LCAS 10/01/2019

## 2019-10-02 NOTE — Telephone Encounter (Signed)
LMTCB

## 2019-10-03 NOTE — Telephone Encounter (Signed)
Spoke with patient and he did not go to [facility] or anywhere else yet. Doing okay on the zoloft, He states he works 3rd shift so its hard to get [employer] of him. I asked if he had spoke with Dr. Lurlean Leyden office yet and he didn't know if they had called or not yet. I gave him the number to that office and he looked thru his missed calls; they did call him. He is going to call them back and schedule his appt.

## 2019-10-03 NOTE — Telephone Encounter (Signed)
LMTCB; also sent patient a letter

## 2019-10-15 ENCOUNTER — Other Ambulatory Visit: Payer: Self-pay

## 2019-10-15 ENCOUNTER — Ambulatory Visit (INDEPENDENT_AMBULATORY_CARE_PROVIDER_SITE_OTHER): Payer: BC Managed Care – PPO | Admitting: Licensed Clinical Social Worker

## 2019-10-15 ENCOUNTER — Encounter: Payer: Self-pay | Admitting: Licensed Clinical Social Worker

## 2019-10-15 DIAGNOSIS — F331 Major depressive disorder, recurrent, moderate: Secondary | ICD-10-CM

## 2019-10-15 NOTE — Progress Notes (Signed)
Patient Location: Home  Provider Location: Home Office   Virtual Visit via Video Note  I connected with Wayne [name] on 10/15/19 at  8:00 AM EDT by a video enabled telemedicine application and verified that I am speaking with the correct person using two identifiers.   I discussed the limitations of evaluation and management by telemedicine and the availability of in person appointments. The patient expressed understanding and agreed to proceed.  THERAPY PROGRESS NOTE  Session Time: 77 Minutes  Participation Level: Active  Behavioral Response: CasualAlertDepressed  Type of Therapy: Individual Therapy  Treatment Goals addressed: Coping  Interventions: CBT  Summary: Wayne [name] is a 39 y.o. male who presents with depression sxs. Pt reported feeling okay today and had a stressful week, but also working towards discussing issues more openly with spouse and making changes to address needs. Pt reported that he would like to spend more time with family and has made plans to speak with manager about adjusting work schedule and to visit friends out of town this weekend. Pt identified continued struggles with thoughts, feeling like he is "stuck in a loop". Pt engaged in mindfulness exercise.  Suicidal/Homicidal: No  Therapist Response: Therapist met with patient for follow up session. Therapist and patient explored communication and thinking habits as well as ways to recharge and resources/supports to draw strength from. Therapist provided psychoeducation around mindfulness skills and engaged patient in self-soothing with the 5 senses. Pt was receptive. Therapist assigned patient homework to choose and practice some mindfulness exercises to try between now and next session.  Plan: Return again in 2 weeks.  Diagnosis: Axis I: MDD, Recurrent, Moderate    Axis II: N/A   [name], LCSW, LCAS 10/15/2019

## 2019-10-25 ENCOUNTER — Telehealth: Payer: Self-pay | Admitting: Nurse Practitioner

## 2019-10-29 ENCOUNTER — Other Ambulatory Visit: Payer: Self-pay

## 2019-10-29 ENCOUNTER — Ambulatory Visit: Payer: BC Managed Care – PPO | Admitting: Licensed Clinical Social Worker

## 2019-12-24 ENCOUNTER — Other Ambulatory Visit: Payer: BC Managed Care – PPO

## 2019-12-24 ENCOUNTER — Other Ambulatory Visit: Payer: Self-pay

## 2019-12-24 DIAGNOSIS — R7401 Elevation of levels of liver transaminase levels: Secondary | ICD-10-CM

## 2019-12-24 DIAGNOSIS — K760 Fatty (change of) liver, not elsewhere classified: Secondary | ICD-10-CM

## 2020-03-24 ENCOUNTER — Ambulatory Visit: Payer: BC Managed Care – PPO | Admitting: Nurse Practitioner

## 2020-08-07 ENCOUNTER — Telehealth: Payer: Self-pay | Admitting: *Deleted

## 2020-08-07 NOTE — Telephone Encounter (Signed)
-----   Message from Dale [place], MD sent at 08/07/2020  4:21 AM EDT ----- Regarding: follow up This is a patient of Fransico Setters.  Received notification - overdue - MRI - to reevaluate liver.  It appears he is seeing psychiatry, but needs to establish with a new PCP and will need f/u regarding his liver.  Can see if he would be agreeable to establish with Marcelino Duster or another PCP.  Thanks.   Dr Lorin Picket

## 2020-08-07 NOTE — Telephone Encounter (Signed)
Called patient: left message for patient to return my call.

## 2020-08-27 IMAGING — US US ABDOMEN COMPLETE
1 series · 13 of 25 positions shown · non-contrast
Comparison: None.

CLINICAL DATA: Elevated LFTs.

EXAM:
ABDOMEN ULTRASOUND COMPLETE

[Series 1: us abdomen complete · 0.23mm/px · 13 of 121 slices shown]
[im 1/121]
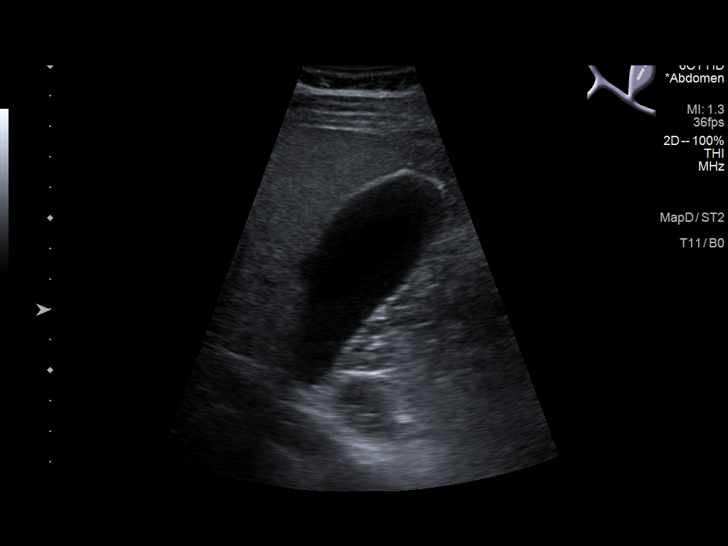
[im 11/121]
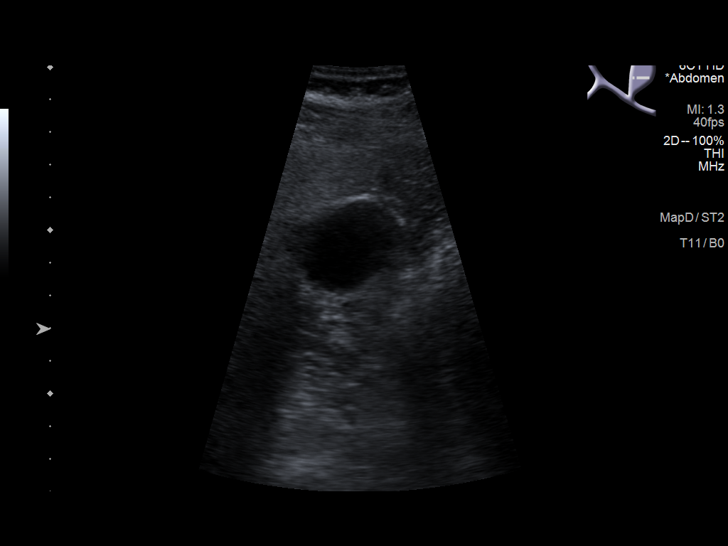
[im 21/121]
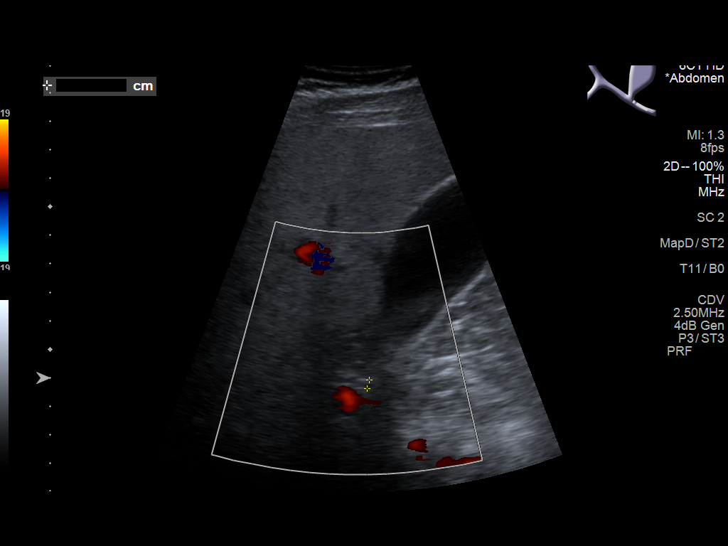
[im 31/121]
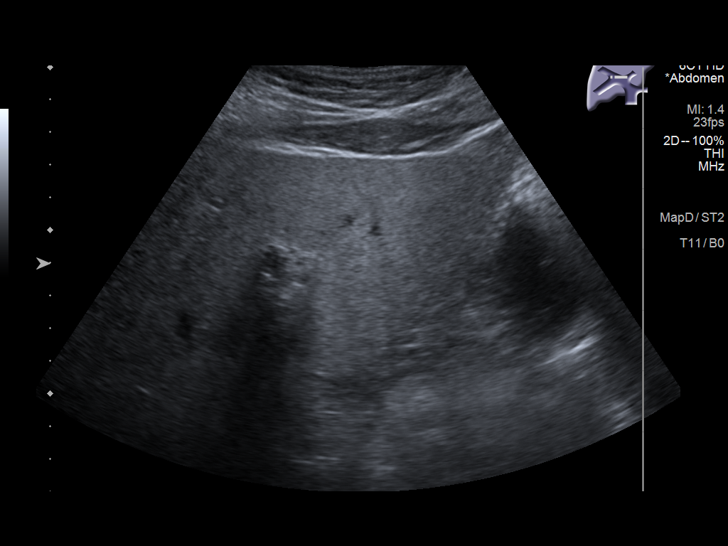
[im 41/121]
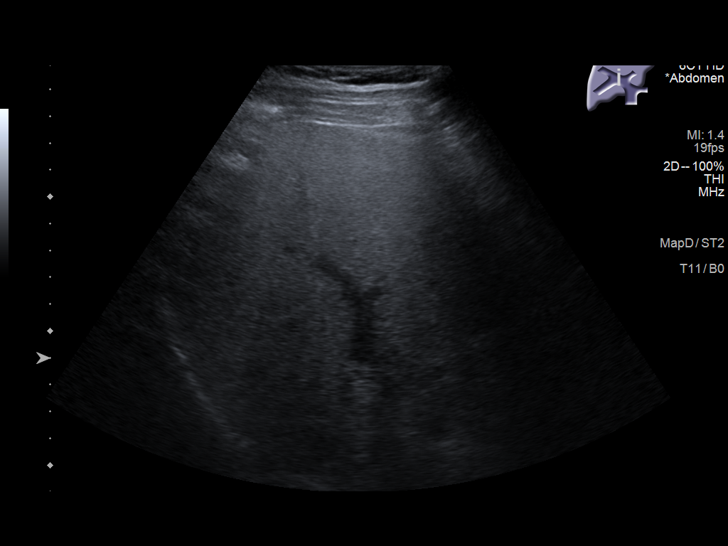
[im 51/121]
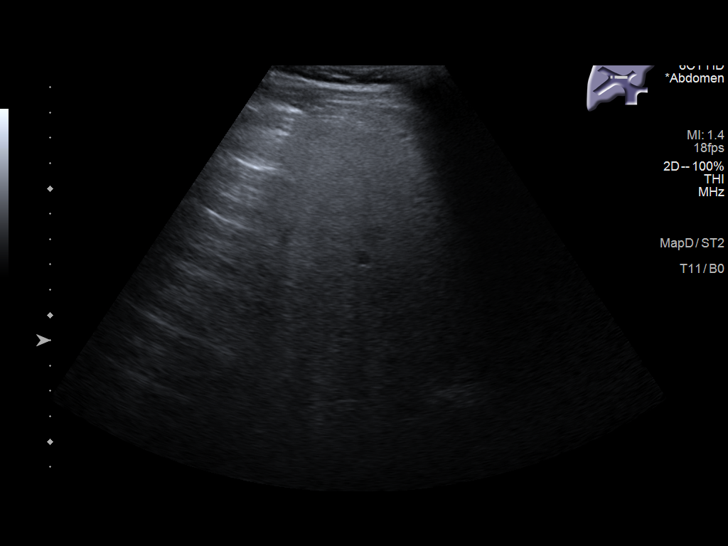
[im 61/121]
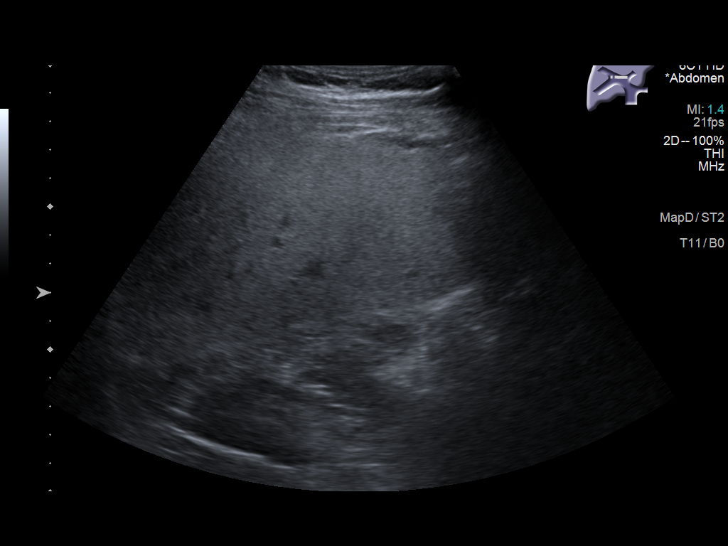
[im 71/121]
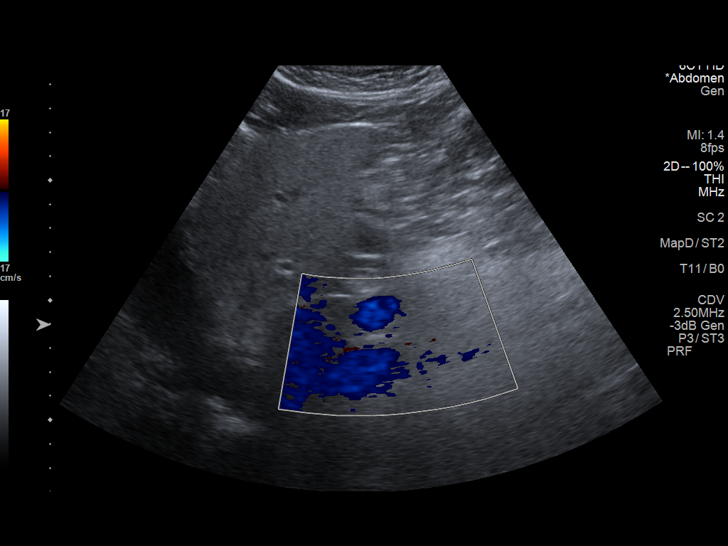
[im 81/121]
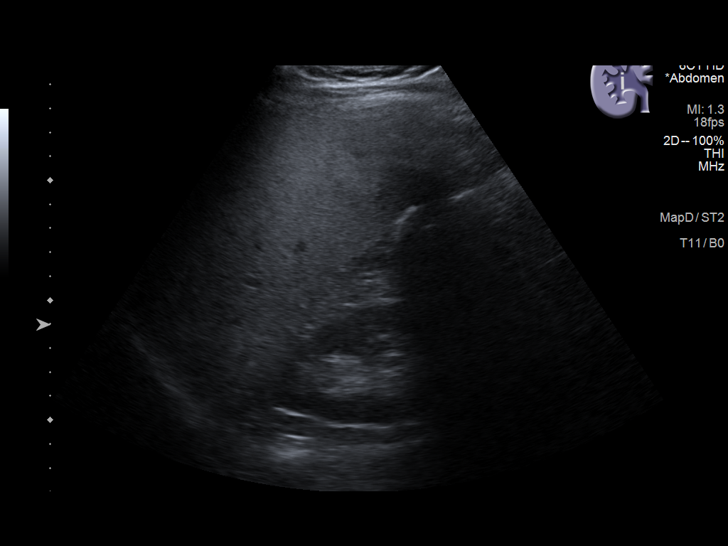
[im 91/121]
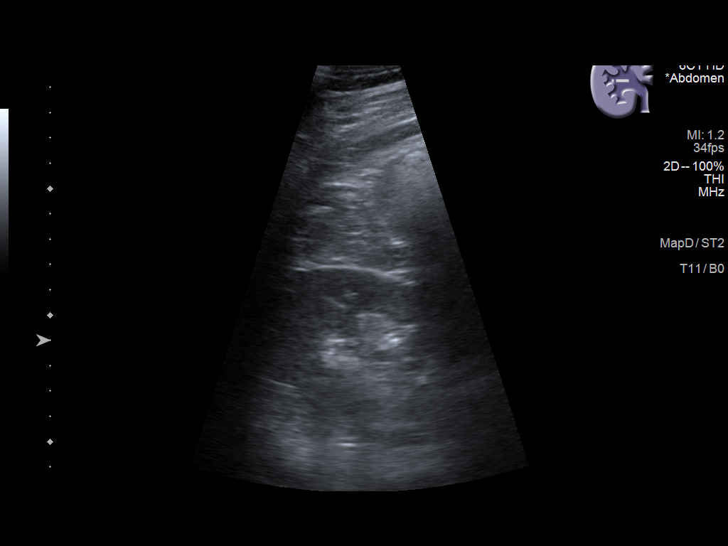
[im 101/121]
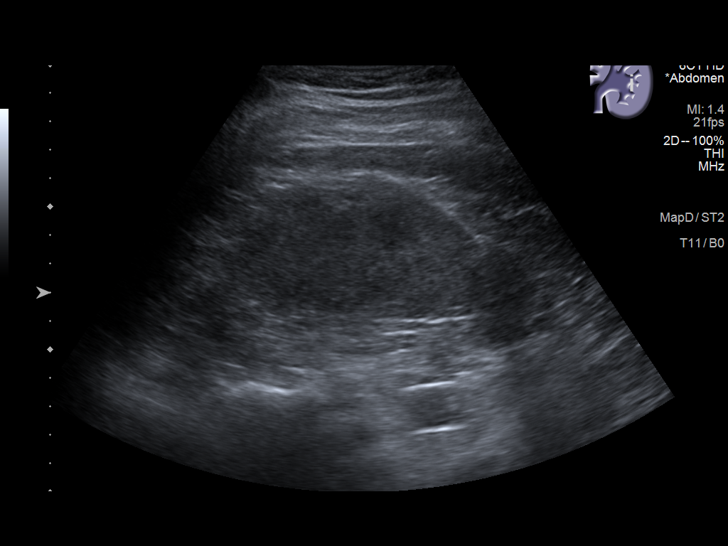
[im 111/121]
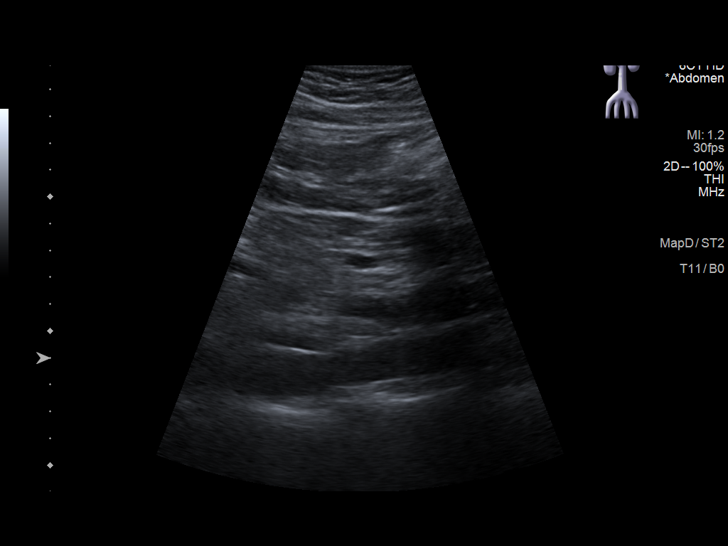
[im 121/121]
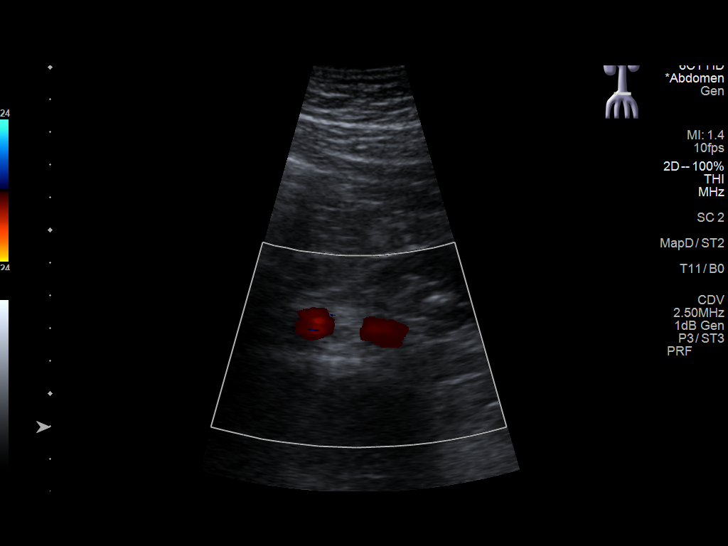

[13 of 25 positions shown; findings below may reference images not displayed]

FINDINGS: Gallbladder: No gallstones or wall thickening visualized. No
sonographic Murphy sign noted by sonographer.

Common bile duct: Diameter: 0.3 cm, within normal limits.

Liver: The liver parenchymal echogenicity is diffusely increased.
Adjacent to the gallbladder fossa there is an ill-defined hypoechoic
area measuring 2.6 x 3.4 x 3.9 cm, likely focal fatty sparing.
Portal vein is patent on color Doppler imaging with normal direction
of blood flow towards the liver.

IVC: No abnormality visualized.

Pancreas: Visualized portion unremarkable.

Spleen: Size and appearance within normal limits.

Right Kidney: Length: 12.1 cm. Echogenicity within normal limits. No
mass or hydronephrosis visualized.

Left Kidney: Length: 11.9 cm. Echogenicity within normal limits. No
mass or hydronephrosis visualized.

Abdominal aorta: No aneurysm visualized.

Other findings: None.
IMPRESSION: Diffusely increased liver parenchymal echogenicity most commonly
seen with hepatic steatosis. There is a 3.9 cm hypoechoic area
adjacent to the gallbladder fossa which most likely represents focal
fatty sparing but is indeterminate. Consider liver MRI for further
evaluation.

## 2021-06-10 ENCOUNTER — Ambulatory Visit: Payer: BC Managed Care – PPO | Admitting: Nurse Practitioner

## 2021-06-10 ENCOUNTER — Encounter: Payer: Self-pay | Admitting: Nurse Practitioner

## 2021-06-10 VITALS — BP 120/76 | HR 80 | Temp 97.5°F | Resp 14 | Ht 74.0 in | Wt 192.2 lb

## 2021-06-10 DIAGNOSIS — F321 Major depressive disorder, single episode, moderate: Secondary | ICD-10-CM | POA: Diagnosis not present

## 2021-06-10 DIAGNOSIS — Z789 Other specified health status: Secondary | ICD-10-CM | POA: Diagnosis not present

## 2021-06-10 DIAGNOSIS — F411 Generalized anxiety disorder: Secondary | ICD-10-CM | POA: Diagnosis not present

## 2021-06-10 DIAGNOSIS — R7989 Other specified abnormal findings of blood chemistry: Secondary | ICD-10-CM | POA: Diagnosis not present

## 2021-06-10 DIAGNOSIS — Z720 Tobacco use: Secondary | ICD-10-CM

## 2021-06-10 MED ORDER — SERTRALINE HCL 25 MG PO TABS: ORAL_TABLET | ORAL | 0 refills | Status: DC

## 2021-06-10 MED ORDER — HYDROXYZINE PAMOATE 25 MG PO CAPS: 25.0000 mg | ORAL_CAPSULE | Freq: Two times a day (BID) | ORAL | 0 refills | Status: DC | PRN

## 2021-06-10 NOTE — Assessment & Plan Note (Signed)
We will recheck since patient is having depression symptoms

## 2021-06-10 NOTE — Patient Instructions (Signed)
Nice to see you today I want to see you in 6 weeks to make sure the medication is helping I have referred you to therapy, you should hear from them hopefully this week. Let me know if not

## 2021-06-10 NOTE — Assessment & Plan Note (Signed)
Currently smoking we will revisit when patient is a better state mentally

## 2021-06-10 NOTE — Assessment & Plan Note (Signed)
Has been on sertraline and Wellbutrin in the past.  Patient is having difficulty with sleeping we will start him on hydroxyzine 25 mg twice daily as needed.  Did discuss sedation precautions regarding this medication

## 2021-06-10 NOTE — Assessment & Plan Note (Signed)
PHQ-9 and GAD-7 administered in office.  Patient denies SI/HI/AVH.  We will start patient on sertraline 25 mg nightly for 2 weeks.  Patient tolerated medication well we will bump up to 50 mg nightly.  Did discuss common side effects inclusive of increasing in SI/HI, weight gain, sexual dysfunction, nausea and diarrhea.  Also refer patient for counseling.  Referral replaced today

## 2021-06-10 NOTE — Progress Notes (Signed)
New Patient Office Visit  Subjective    Patient ID: Wayne [name], male    DOB: [birth year]  Age: 41 y.o. MRN: [id number]  CC:  Chief Complaint  Patient presents with   Transfer of Care    From [facility] location    Depression    Has not seen anyone in the last 1 1/2 years.    HPI Wayne [name] presents to establish care/ TOC  Depression: states that he has been on medication in in the past but did not stick with it. States he took the medication for approx 4 months. Has tried sertraline and welbutrion in the past.  States last time he tried sertraline he felt like he had difficulty climax during sexual intercourse.  Patient has also tried therapy in the past States that he found out that his wife has been having an affair. 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  He states he was unhappy with how it was handled that he was not having any SI/HI/AVH but the way he was questioned he did get placed on suicide watch.  Patient states he has never had any attempt or plan to take his life.  States he does have a 69 and 24 year old children   Alcohol use: use to drink 4-5 shots on the weekeneds every week.  He has decreased the intake some did encourage patient to continue decreasing uses this can exacerbate some of the feelings he is currently experiencing  GAD: Has been on Wellbutrin therapy in the past.  Tobacco use: has quit in the past.  Would like to quit but currently not the mind frame to do such.  We will revisit this in the future   Outpatient Encounter Medications as of 06/10/2021  Medication Sig   hydrOXYzine (VISTARIL) 25 MG capsule Take 1 capsule (25 mg total) by mouth 2 (two) times daily as needed for anxiety.   sertraline (ZOLOFT) 25 MG  tablet Take 1 tablet (25 mg total) by mouth daily for 15 days, THEN 2 tablets (50 mg total) daily for 15 days.   No facility-administered encounter medications on file as of 06/10/2021.    Past Medical History:  Diagnosis Date   Chicken pox    Depression    GERD (gastroesophageal reflux disease)    History of alcohol abuse     Past Surgical History:  Procedure Laterality Date   NO PAST SURGERIES      Family History  Problem Relation Age of Onset   Diabetes Mother    Hypertension Mother    Alcohol abuse Mother    Cancer Paternal Aunt        lung   Stroke Maternal Grandmother    Diabetes Maternal Grandmother    Heart disease Paternal Grandfather     Social History   Socioeconomic History   Marital status: Married    Spouse name: Not on file   Number of children: 2   Years of education: Not on file   Highest education level: GED or equivalent  Occupational History   Occupation: medical lab support services     Employer: [employer]  Tobacco Use   Smoking status: Every Day    Packs/day: 0.50    Years: 20.00    Pack years:  10.00    Types: Cigarettes   Smokeless tobacco: Never  Vaping Use   Vaping Use: Never used  Substance and Sexual Activity   Alcohol use: Yes    Comment: about 2 times a month 5-6 shots of liqour   Drug use: Yes    Types: Marijuana    Comment: 3-4 times a week and takes it for anxiety   Sexual activity: Yes    Birth control/protection: None  Other Topics Concern   Not on file  Social History Narrative   Wife and kids x 2 ages 65 DTR and 24 yo son      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  Negative for chills and fever.  Respiratory:  Negative for cough and shortness of breath.   Cardiovascular:  Negative for chest pain.  Psychiatric/Behavioral:  Negative for hallucinations and suicidal ideas.        Objective    BP 120/76   Pulse 80   Temp (!) 97.5 F (36.4 C)   Resp 14   Ht 6\' 2"  (1.88 m)   Wt 192 lb 4 oz (87.2 kg)   SpO2 96%   BMI 24.68 kg/m   Physical Exam Vitals and nursing note reviewed.  Constitutional:      Appearance: Normal appearance.  HENT:     Right Ear: Tympanic membrane, ear canal and external ear normal.     Left Ear: Tympanic membrane, ear canal and external ear normal.     Mouth/Throat:     Mouth: Mucous membranes are moist.     Pharynx: Oropharynx is clear.  Eyes:     Extraocular Movements: Extraocular movements intact.     Pupils: Pupils are equal, round, and reactive to light.  Cardiovascular:     Rate and Rhythm: Normal rate and regular rhythm.     Pulses: Normal pulses.     Heart sounds: Normal heart sounds.  Pulmonary:     Effort: Pulmonary effort is normal.     Breath sounds: Normal breath sounds.  Abdominal:     General: Bowel sounds are normal. There is no distension.     Palpations: There is no mass.     Tenderness: There is no abdominal tenderness.     Hernia: No hernia is present.  Musculoskeletal:     Right lower leg: No edema.  Lymphadenopathy:     Cervical: No cervical adenopathy.  Neurological:     General: No focal deficit present.     Mental Status: He is alert.     Deep Tendon Reflexes:     Reflex Scores:      Bicep reflexes are 2+ on the right side and 2+ on the left side.      Patellar reflexes are 2+ on the right side and 2+ on the left side.    Comments: Bilateral upper and lower extremity strength 5/5  Psychiatric:        Attention and Perception: Attention normal.        Mood and Affect: Mood is depressed.        Speech: Speech normal.  Behavior: Behavior is withdrawn.        Thought  Content: Thought content normal.        Cognition and Memory: Cognition normal.        Judgment: Judgment normal.        Assessment & Plan:   Problem List Items Addressed This Visit       Other   Tobacco abuse    Currently smoking we will revisit when patient is a better state mentally       Alcohol use    Patient has cut down on alcohol consumption.  Courage patient to continue doing so.  Did not recommend patient drinking alcohol as a coping mechanism to deal with his feelings currently that this could exacerbate some of the symptoms he is experiencing.  Patient states that she found out about his wife he is abstain from alcohol had no history of alcohol withdrawal in the past       GAD (generalized anxiety disorder)    Has been on sertraline and Wellbutrin in the past.  Patient is having difficulty with sleeping we will start him on hydroxyzine 25 mg twice daily as needed.  Did discuss sedation precautions regarding this medication       Relevant Medications   sertraline (ZOLOFT) 25 MG tablet   hydrOXYzine (VISTARIL) 25 MG capsule   Other Relevant Orders   Ambulatory referral to Psychology   TSH   Low vitamin D level    We will recheck since patient is having depression symptoms       Relevant Orders   VITAMIN D 25 Hydroxy (Vit-D Deficiency, Fractures)   Depression, major, single episode, moderate (HCC) - Primary    PHQ-9 and GAD-7 administered in office.  Patient denies SI/HI/AVH.  We will start patient on sertraline 25 mg nightly for 2 weeks.  Patient tolerated medication well we will bump up to 50 mg nightly.  Did discuss common side effects inclusive of increasing in SI/HI, weight gain, sexual dysfunction, nausea and diarrhea.  Also refer patient for counseling.  Referral replaced today       Relevant Medications   sertraline (ZOLOFT) 25 MG tablet   hydrOXYzine (VISTARIL) 25 MG capsule   Other Relevant Orders   Ambulatory referral to Psychology   CBC    Comprehensive metabolic panel   TSH    Return in about 6 weeks (around 07/22/2021) for Depression recheck .   Romilda Garret, NP

## 2021-06-10 NOTE — Assessment & Plan Note (Signed)
Patient has cut down on alcohol consumption.  Courage patient to continue doing so.  Did not recommend patient drinking alcohol as a coping mechanism to deal with his feelings currently that this could exacerbate some of the symptoms he is experiencing.  Patient states that she found out about his wife he is abstain from alcohol had no history of alcohol withdrawal in the past

## 2021-06-11 LAB — COMPREHENSIVE METABOLIC PANEL
ALT: 50 IU/L — ABNORMAL HIGH (ref 0–44)
AST: 58 IU/L — ABNORMAL HIGH (ref 0–40)
Albumin/Globulin Ratio: 2.3 — ABNORMAL HIGH (ref 1.2–2.2)
Albumin: 4.9 g/dL (ref 4.0–5.0)
Alkaline Phosphatase: 61 IU/L (ref 44–121)
BUN/Creatinine Ratio: 8 — ABNORMAL LOW (ref 9–20)
BUN: 7 mg/dL (ref 6–24)
Bilirubin Total: 0.8 mg/dL (ref 0.0–1.2)
CO2: 23 mmol/L (ref 20–29)
Calcium: 9.6 mg/dL (ref 8.7–10.2)
Chloride: 100 mmol/L (ref 96–106)
Creatinine, Ser: 0.92 mg/dL (ref 0.76–1.27)
Globulin, Total: 2.1 g/dL (ref 1.5–4.5)
Glucose: 97 mg/dL (ref 70–99)
Potassium: 3.5 mmol/L (ref 3.5–5.2)
Sodium: 139 mmol/L (ref 134–144)
Total Protein: 7 g/dL (ref 6.0–8.5)
eGFR: 108 mL/min/{1.73_m2} (ref >59)

## 2021-06-11 LAB — CBC
Hematocrit: 45.8 % (ref 37.5–51.0)
Hemoglobin: 15.4 g/dL (ref 13.0–17.7)
MCH: 30.1 pg (ref 26.6–33.0)
MCHC: 33.6 g/dL (ref 31.5–35.7)
MCV: 90 fL (ref 79–97)
Platelets: 344 10*3/uL (ref 150–450)
RBC: 5.11 x10E6/uL (ref 4.14–5.80)
RDW: 12.1 % (ref 11.6–15.4)
WBC: 8.9 10*3/uL (ref 3.4–10.8)

## 2021-06-11 LAB — VITAMIN D 25 HYDROXY (VIT D DEFICIENCY, FRACTURES): Vit D, 25-Hydroxy: 18.4 ng/mL — ABNORMAL LOW (ref 30.0–100.0)

## 2021-06-11 LAB — TSH: TSH: 0.776 u[IU]/mL (ref 0.450–4.500)

## 2021-06-12 ENCOUNTER — Other Ambulatory Visit: Payer: Self-pay | Admitting: Nurse Practitioner

## 2021-06-12 ENCOUNTER — Telehealth: Payer: Self-pay

## 2021-06-12 ENCOUNTER — Encounter: Payer: Self-pay | Admitting: Nurse Practitioner

## 2021-06-12 DIAGNOSIS — R7989 Other specified abnormal findings of blood chemistry: Secondary | ICD-10-CM

## 2021-06-12 MED ORDER — VITAMIN D (ERGOCALCIFEROL) 1.25 MG (50000 UNIT) PO CAPS: 50000.0000 [IU] | ORAL_CAPSULE | ORAL | 0 refills | Status: DC

## 2021-06-12 NOTE — Telephone Encounter (Signed)
Patient sent in leave of absence forms.  I have made a copy and put in your inbox.  No due date stated on forms.  The last day patient worked was 06/03/21, anticipates to be back at work on 06/17/21.  May possibly have to be out in the future for same reason.  Has an appt with a therapist on 06/15/21.

## 2021-06-15 ENCOUNTER — Ambulatory Visit (INDEPENDENT_AMBULATORY_CARE_PROVIDER_SITE_OTHER): Payer: BC Managed Care – PPO | Admitting: Psychology

## 2021-06-15 DIAGNOSIS — F331 Major depressive disorder, recurrent, moderate: Secondary | ICD-10-CM | POA: Diagnosis not present
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
## 2021-06-16 ENCOUNTER — Telehealth: Payer: Self-pay | Admitting: Nurse Practitioner

## 2021-06-16 NOTE — Telephone Encounter (Signed)
Filling out patients FMLA paper work and it asks the question Patient-reported complaints/symptoms impacting activities of daily living and work? Can we reach out and ask the patient what his symptoms or complaints are and how they are effecting the daily life and competing work

## 2021-06-17 NOTE — Telephone Encounter (Signed)
See other phone note about this-duplicate

## 2021-06-17 NOTE — Telephone Encounter (Signed)
Message sent to [facility] about a question on the form once we hear back form will be complete

## 2021-06-17 NOTE — Telephone Encounter (Signed)
Spoke with patient. Symptoms are Inability to sleep, eat and focus. FMLA papers faxed. Patient aware.

## 2021-06-24 ENCOUNTER — Ambulatory Visit (INDEPENDENT_AMBULATORY_CARE_PROVIDER_SITE_OTHER): Payer: BC Managed Care – PPO | Admitting: Psychology

## 2021-06-24 DIAGNOSIS — F331 Major depressive disorder, recurrent, moderate: Secondary | ICD-10-CM

## 2021-06-24 NOTE — Progress Notes (Signed)
Behavioral   Name: Wayne [name] Date: 06/24/2021 MRN: [id number] DOB: Oct 31, 1980 PCP: [name], NP  Time spent: 11:00 - 11:55 AM   Reason for Visit Wayne [name] Problem:  Pt states that he recently discovered that his wife had an affair.  Two and a half weeks ago he made the discovery.  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  She states that she doesn't want to give up the affair relationship but she also wants to keep the same living arrangements.  Pt states that his wife shared that his mental health issues contributed to their marital problems.  He was able to accept his responsibility for the problems in the relationship.  Wayne [name] states that they both have had a history of indiscretions.  For as long as he can remember he has had depression.  He states that he at different points in his life he sought out treatment but he never stuck with it.     Wayne [name] participated in the creation of his treatment plan and gave his consent.   Diagnoses:  Major depressive disorder, recurrent episode, moderate (HCC)   Individualized Treatment Plan      Strengths: loves his family, hard worker  Supports: work friends   Goal/Needs for Treatment:  In order of importance to patient 1) Manage crisis and stabilize living situation 2) Facilitate decision making and provide guidance while making a determination about his marriage 3) Learn about the impact of depression and coping skills to manage depression   Client Statement of Needs: States he is in crisis after discovering his wife's affair.   Treatment Level: Outpatient Individual Psychotherapy  Symptoms:    GAD-7 = 20 (Severe Anxiety) feeling nervous, worrying too much and not able to control worry, trouble relaxing, restless, and fearful of terrible things happening nearly every day.  Becoming easily annoyed over half the time.  PHQ-9 = 15 (Moderately  Severe Depression) Feeling down and having trouble sleeping nearly everyday.  Little interest or pleasure in doing things, poor appetite, poor self esteem and trouble concentrating more than half the time.  Moving and speaking more slowly for several days in the past two weeks.    Client Treatment Preferences: needs a therapist with experience treating someone who has    Healthcare consumer's goal for treatment:  Psychologist, [name], [name] will support the patient's ability to achieve the goals identified. Cognitive Behavioral Therapy, Dialectical Behavioral Therapy, Motivational Interviewing, parent training, and other evidenced-based practices will be used to promote progress towards healthy functioning.   Healthcare consumer Wayne [name] will: Actively participate in therapy, working towards healthy functioning.    *Justification for Continuation/Discontinuation of Goal: R=Revised, O=Ongoing, A=Achieved, D=Discontinued  Goal 1) Manage crisis and stabilize living situation 5 Point Likert rating baseline date: Target Date Goal Was reviewed Status Code Progress towards goal/Likert rating  06/25/2022                Goal 2) Facilitate decision making and provide guidance while making a determination about his marriage 5 Point Likert rating baseline date: Target Date Goal Was reviewed Status Code Progress towards goal/Likert rating  06/25/2022                Goal 3) Learn about the impact of depression and coping skills to manage depression 5 Point Likert rating baseline date: Target Date Goal Was reviewed Status Code Progress  towards goal/Likert rating  06/25/2022                This plan has been reviewed and created by the following participants:  This plan will be reviewed at least every 12 months. Date Behavioral Health Clinician Date Guardian/Patient   06/24/2021 [name], [name]   06/24/2021                     Plan of Care:  Pt will participate in individual  psychotherapy once or twice a week.  The focus of therapy will be to manage crisis and stabilize functioning.  Once crisis has been mitigated, pt would benefit from interventions intended to reduce level of depression and prevent relapse.   [name], PhD

## 2021-06-25 ENCOUNTER — Ambulatory Visit (INDEPENDENT_AMBULATORY_CARE_PROVIDER_SITE_OTHER): Payer: BC Managed Care – PPO | Admitting: Psychology

## 2021-06-25 DIAGNOSIS — F331 Major depressive disorder, recurrent, moderate: Secondary | ICD-10-CM | POA: Diagnosis not present

## 2021-06-25 NOTE — Progress Notes (Signed)
[facility]   Name: Wayne [name] Date: 06/25/2021 MRN: [id number] DOB: [birth year] PCP: [name], NP  Time spent: 9:00 - 9:55 AM   Reason for Visit [name] Problem:    Pt states that he recently discovered that his wife had an affair.  Two and a half weeks ago he made the discovery.  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  She states that she doesn't want to give up the affair relationship but she also wants to keep the same living arrangements.  Pt states that his wife shared that his mental health issues contributed to their marital problems.  He was able to accept his responsibility for the problems in the relationship.  Castiel states that they both have had a history of indiscretions.  For as long as he can remember he has had depression.  He states that he at different points in his life he sought out treatment but he never stuck with it.    Progress Note:  Kayhan Boardley reports that he spoke to his wife and made a request that she not make contact with the other party while he was in the house.  Their daughter was present for the discussion and functioned as a mediator.  They agreed to stay together and to continue to work on their relationship.  We d/e/p how they are going to manage the "no contact" rule given that they are all in the same gaming community.  Since the crisis seems to have abated, Marquavious was able to share more of the back the history of affair.  We d/e the implications for them as a couple and their interactions with this gaming community.  Lastly, we d/e what they needed from each other to rebuild their trust in one another.     Diagnoses:  No diagnosis found.   Individualized Treatment Plan      Strengths: loves his family, hard worker  Supports: work friends   Goal/Needs for Treatment:  In order of importance to patient 1) Manage crisis and stabilize living situation 2) Facilitate  decision making and provide guidance while making a determination about his marriage 3) Learn about the impact of depression and coping skills to manage depression   Client Statement of Needs: States he is in crisis after discovering his wife's affair.   Treatment Level: Outpatient Individual Psychotherapy  Symptoms:    GAD-7 = 20 (Severe Anxiety) feeling nervous, worrying too much and not able to control worry, trouble relaxing, restless, and fearful of terrible things happening nearly every day.  Becoming easily annoyed over half the time.  PHQ-9 = 15 (Moderately Severe Depression) Feeling down and having trouble sleeping nearly everyday.  Little interest or pleasure in doing things, poor appetite, poor self esteem and trouble concentrating more than half the time.  Moving and speaking more slowly for several days in the past two weeks.    Client Treatment Preferences: needs a therapist with experience treating someone who has    Healthcare consumer's goal for treatment:  Psychologist, [name], [name] will support the patient's ability to achieve the goals identified. Cognitive Behavioral Therapy, Dialectical Behavioral Therapy, Motivational Interviewing, parent training, and other evidenced-based practices will be used to promote progress towards healthy functioning.   Healthcare consumer Eryc Loftus will: Actively participate in therapy, working towards healthy functioning.    *Justification for Continuation/Discontinuation of Goal: R=Revised, O=Ongoing, A=Achieved, D=Discontinued  Goal 1)  Manage crisis and stabilize living situation 5 Point Likert rating baseline date: Target Date Goal Was reviewed Status Code Progress towards goal/Likert rating  06/25/2022                Goal 2) Facilitate decision making and provide guidance while making a determination about his marriage 5 Point Likert rating baseline date: Target Date Goal Was reviewed Status Code Progress towards  goal/Likert rating  06/25/2022                Goal 3) Learn about the impact of depression and coping skills to manage depression 5 Point Likert rating baseline date: Target Date Goal Was reviewed Status Code Progress towards goal/Likert rating  06/25/2022                This plan has been reviewed and created by the following participants:  This plan will be reviewed at least every 12 months. Date Behavioral Health Clinician Date Guardian/Patient   06/24/2021 [name], [name]   06/24/2021                     Plan of Care:  Pt will participate in individual psychotherapy once or twice a week.  The focus of therapy will be to manage crisis and stabilize functioning.  Once crisis has been mitigated, pt would benefit from interventions intended to reduce level of depression and prevent relapse.   [name], PhD                 [name], PhD

## 2021-06-29 ENCOUNTER — Ambulatory Visit (INDEPENDENT_AMBULATORY_CARE_PROVIDER_SITE_OTHER): Payer: BC Managed Care – PPO | Admitting: Psychology

## 2021-06-29 DIAGNOSIS — F331 Major depressive disorder, recurrent, moderate: Secondary | ICD-10-CM | POA: Diagnosis not present

## 2021-06-29 NOTE — Progress Notes (Addendum)
Wayne [name]  PROGRESS NOTE  Name: Wayne [name] Date: 06/29/2021 MRN: [id number] DOB: [birth year] PCP: [name], NP  Time spent: 9:00 - 9:55 AM   Reason for Visit Wayne [name] Problem:    Pt states that he recently discovered that his wife had an affair.  Two and a half weeks ago he made the discovery.  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  She states that she doesn't want to give up the affair relationship but she also wants to keep the same living arrangements.  Pt states that his wife shared that his mental health issues contributed to their marital problems.  He was able to accept his responsibility for the problems in the relationship.  Wayne [name] states that they both have had a history of indiscretions.  For as long as he can remember he has had depression.  He states that he at different points in his life he sought out treatment but he never stuck with it.    Progress Note:  Wayne [name] reports that his wife was unhappy that he is pressuring her to make a decision.  We d/e/p where she was coming from, his need to be patient and that this was an opportunity to show that her that he is trying to change.  We d/e/p the history of his past infidelities (3) and how these are impacting his present day relationship.     Diagnoses:  Major depressive disorder, recurrent episode, moderate (HCC)   Individualized Treatment Plan       Strengths: loves his family, hard worker  Supports: work friends   Goal/Needs for Treatment:  In order of importance to patient 1) Manage crisis and stabilize living situation 2) Facilitate decision making and provide guidance while making a determination about his marriage 3) Learn about the impact of depression and coping skills to manage depression   Client Statement of Needs: States he is in crisis after discovering his wife's affair.   Treatment Level: Outpatient Individual  Psychotherapy  Symptoms:    GAD-7 = 20 (Severe Anxiety) feeling nervous, worrying too much and not able to control worry, trouble relaxing, restless, and fearful of terrible things happening nearly every day.  Becoming easily annoyed over half the time.  PHQ-9 = 15 (Moderately Severe Depression) Feeling down and having trouble sleeping nearly everyday.  Little interest or pleasure in doing things, poor appetite, poor self esteem and trouble concentrating more than half the time.  Moving and speaking more slowly for several days in the past two weeks.    Client Treatment Preferences: needs a therapist with experience treating someone who has    Healthcare consumer's goal for treatment:  Psychologist, [name], [name] will support the patient's ability to achieve the goals identified. Cognitive Behavioral Therapy, Dialectical Behavioral Therapy, Motivational Interviewing, parent training, and other evidenced-based practices will be used to promote progress towards healthy functioning.   Healthcare consumer Wayne [name] will: Actively participate in therapy, working towards healthy functioning.    *Justification for Continuation/Discontinuation of Goal: R=Revised, O=Ongoing, A=Achieved, D=Discontinued  Goal 1) Manage crisis and stabilize living situation 5 Point Likert rating baseline date: Target Date Goal Was reviewed Status Code Progress towards goal/Likert rating  06/25/2022           O              Goal 2) Facilitate decision making and provide guidance while making a determination about his marriage  5 Point Likert rating baseline date: Target Date Goal Was reviewed Status Code Progress towards goal/Likert rating  06/25/2022           O              Goal 3) Learn about the impact of depression and coping skills to manage depression 5 Point Likert rating baseline date: Target Date Goal Was reviewed Status Code Progress towards goal/Likert rating  06/25/2022           O               This plan has been reviewed and created by the following participants:  This plan will be reviewed at least every 12 months. Date Behavioral Health Clinician Date Guardian/Patient   06/24/2021 [name], [name]   06/24/2021 Wayne [name]                    Plan of Care:  Pt will participate in individual psychotherapy once or twice a week.  The focus of therapy will be to manage crisis and stabilize functioning.  Once crisis has been mitigated, pt would benefit from interventions intended to reduce level of depression and prevent relapse.   [name], PhD

## 2021-07-02 ENCOUNTER — Ambulatory Visit: Payer: BC Managed Care – PPO | Admitting: Psychology

## 2021-07-06 ENCOUNTER — Ambulatory Visit (INDEPENDENT_AMBULATORY_CARE_PROVIDER_SITE_OTHER): Payer: BC Managed Care – PPO | Admitting: Psychology

## 2021-07-06 DIAGNOSIS — F331 Major depressive disorder, recurrent, moderate: Secondary | ICD-10-CM

## 2021-07-06 NOTE — Progress Notes (Addendum)
[place] [facility]  PROGRESS NOTE  Name: Wayne [name] Date: 07/06/2021 MRN: [id number] DOB: Nov 17, 1980 PCP: [name], NP  Time spent: 9:00 - 9:55 AM  Today I met with  Tana Conch Seigler in remote video (WebEx) face-to-face individual psychotherapy.  Distance Site: Client's Home Orginating Site: Dr [name] Remote Office Consent: Obtained verbal consent to transmit  session remotely    Reason for Visit /Presenting Problem:    Pt states that he recently discovered that his wife had an affair.  Two and a half weeks ago he made the discovery.  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  She states that she doesn't want to give up the affair relationship but she also wants to keep the same living arrangements.  Pt states that his wife shared that his mental health issues contributed to their marital problems.  He was able to accept his responsibility for the problems in the relationship.  Gaylan states that they both have had a history of indiscretions.  For as long as he can remember he has had depression.  He states that he at different points in his life he sought out treatment but he never stuck with it.      Diagnoses:  Major depressive disorder, recurrent episode, moderate (HCC)   Individualized Treatment Plan       Strengths: loves his family, hard worker  Supports: work friends   Goal/Needs for Treatment:  In order of importance to patient 1) Manage crisis and stabilize living situation 2) Facilitate decision making and provide guidance while making a determination about his marriage 3) Learn about the impact of depression and coping skills to manage depression   Client Statement of Needs: States he is in crisis after discovering his wife's affair.   Treatment Level: Outpatient Individual Psychotherapy  Symptoms:    GAD-7 = 20 (Severe Anxiety) feeling nervous, worrying too much and not able to control worry,  trouble relaxing, restless, and fearful of terrible things happening nearly every day.  Becoming easily annoyed over half the time.  PHQ-9 = 15 (Moderately Severe Depression) Feeling down and having trouble sleeping nearly everyday.  Little interest or pleasure in doing things, poor appetite, poor self esteem and trouble concentrating more than half the time.  Moving and speaking more slowly for several days in the past two weeks.    Client Treatment Preferences: needs a therapist with experience treating someone who has    Healthcare consumer's goal for treatment:  Psychologist, [name], [name] will support the patient's ability to achieve the goals identified. Cognitive Behavioral Therapy, Dialectical Behavioral Therapy, Motivational Interviewing, parent training, and other evidenced-based practices will be used to promote progress towards healthy functioning.   Healthcare consumer Jancarlo [employer]s will: Actively participate in therapy, working towards healthy functioning.    *Justification for Continuation/Discontinuation of Goal: R=Revised, O=Ongoing, A=Achieved, D=Discontinued  Goal 1) Manage crisis and stabilize living situation 5 Point Likert rating baseline date: Target Date Goal Was reviewed Status Code Progress towards goal/Likert rating  06/25/2022           O              Goal 2) Facilitate decision making and provide guidance while making a determination about his marriage 5 Point Likert rating baseline date: Target Date Goal Was reviewed Status Code Progress towards goal/Likert rating  06/25/2022           O  Goal 3) Learn about the impact of depression and coping skills to manage depression 5 Point Likert rating baseline date: Target Date Goal Was reviewed Status Code Progress towards goal/Likert rating  06/25/2022           O              This plan has been reviewed and created by the following participants:  This plan will be reviewed at least every 12  months. Date Behavioral Health Clinician Date Guardian/Patient   06/24/2021 [name], [name]   06/24/2021 Yadriel Feldpausch                    Josephine reports that he and his wife agreed to separate.  He shared all that occurred since our last session.  We d/e/p the implications for their children and future steps.  Lastly, we d/e/p the loss of the marriage.     [name], PhD

## 2021-07-11 ENCOUNTER — Emergency Department
Admission: EM | Admit: 2021-07-11 | Discharge: 2021-07-11 | Disposition: A | Discharge status: Home / Self Care | Payer: BC Managed Care – PPO | Attending: Emergency Medicine | Admitting: Emergency Medicine

## 2021-07-11 ENCOUNTER — Other Ambulatory Visit: Payer: Self-pay

## 2021-07-11 DIAGNOSIS — Z049 Encounter for examination and observation for unspecified reason: Secondary | ICD-10-CM | POA: Insufficient documentation

## 2021-07-11 DIAGNOSIS — R69 Illness, unspecified: Secondary | ICD-10-CM | POA: Diagnosis not present

## 2021-07-11 DIAGNOSIS — Z0389 Encounter for observation for other suspected diseases and conditions ruled out: Secondary | ICD-10-CM | POA: Diagnosis not present

## 2021-07-11 DIAGNOSIS — R5381 Other malaise: Secondary | ICD-10-CM | POA: Diagnosis not present

## 2021-07-11 NOTE — ED Notes (Signed)
Signature pad not working. Verbal consent for DC.

## 2021-07-11 NOTE — Discharge Instructions (Signed)
Please seek medical attention for any high fevers, chest pain, shortness of breath, change in behavior, persistent vomiting, bloody stool or any other new or concerning symptoms.  
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
## 2021-07-11 NOTE — ED Notes (Signed)
Pt came voluntarily. Pt did not request to come but was not going to argue about the situation. Pt denies any OD attempt today. Pt advised he and his wife are having a disagreement and she called PD today.

## 2021-07-11 NOTE — ED Provider Notes (Signed)
   [facility] Provider Note    Event Date/Time   First MD Initiated Contact with Patient 07/11/21 1842     (approximate)   History   Psychiatric Evaluation   HPI  Wayne [name] is a 41 y.o. male  who presents to the emergency department because of concern for possible overdose. Patient is not sure who the person was that told him he had to come to the ED but stated it was from some acronym (per nurse triage it does sound like [facility] was at scene and perhaps that's who had concern). The patient does state that he was having an issue with his wife, and it was his wife who stated he had overdosed. The patient denies any overdose. States he has been taking his medication as prescribed. Denies any SI.  Physical Exam   Triage Vital Signs: ED Triage Vitals  Enc Vitals Group     BP 07/11/21 1829 121/80     Pulse Rate 07/11/21 1829 66     Resp 07/11/21 1829 16     Temp 07/11/21 1829 98.5 F (36.9 C)     Temp Source 07/11/21 1829 Oral     SpO2 07/11/21 1829 100 %     Weight 07/11/21 1826 182 lb (82.6 kg)     Height 07/11/21 1826 6\' 2"  (1.88 m)     Head Circumference --      Peak Flow --      Pain Score 07/11/21 1825 0     Pain Loc --      Pain Edu? --      Excl. in GC? --     Most recent vital signs: Vitals:   07/11/21 1829  BP: 121/80  Pulse: 66  Resp: 16  Temp: 98.5 F (36.9 C)  SpO2: 100%   General: Awake, alert, oriented. CV:  Good peripheral perfusion. Regular rate and rhythm. Resp:  Normal effort. Lungs clear. Abd:  No distention.  Psych:  Calm. Denies SI.    ED Results / Procedures / Treatments   Labs (all labs ordered are listed, but only abnormal results are displayed) None   EKG  None   RADIOLOGY None   PROCEDURES:  Critical Care performed: No  Procedures   MEDICATIONS ORDERED IN ED: Medications - No data to display   IMPRESSION / MDM / ASSESSMENT AND PLAN / ED COURSE  I reviewed the triage vital signs and the  nursing notes.                              Differential diagnosis includes, but is not limited to, overdose, not overdose.  Patient's presentation is most consistent with acute presentation with potential threat to life or bodily function.  Patient presented to the emergency department today because of apparent concern for overdose. Patient denies overdose or suicidal ideation. He declines evaluation. Given lack of SI do not feel patient meets IVC criteria.    FINAL CLINICAL IMPRESSION(S) / ED DIAGNOSES   Final diagnoses:  Observation or evaluation for suspected condition      Note:  This document was prepared using Dragon voice recognition software and may include unintentional dictation errors.    09/11/21, MD 07/11/21 [phone]

## 2021-07-12 ENCOUNTER — Other Ambulatory Visit: Payer: Self-pay | Admitting: Nurse Practitioner

## 2021-07-12 DIAGNOSIS — F321 Major depressive disorder, single episode, moderate: Secondary | ICD-10-CM

## 2021-07-13 ENCOUNTER — Other Ambulatory Visit: Payer: Self-pay | Admitting: Nurse Practitioner

## 2021-07-13 DIAGNOSIS — F411 Generalized anxiety disorder: Secondary | ICD-10-CM

## 2021-07-13 MED ORDER — SERTRALINE HCL 50 MG PO TABS: 50.0000 mg | ORAL_TABLET | Freq: Every day | ORAL | 0 refills | Status: DC

## 2021-07-13 NOTE — Telephone Encounter (Signed)
Patient advised.

## 2021-07-22 ENCOUNTER — Ambulatory Visit: Payer: BC Managed Care – PPO | Admitting: Nurse Practitioner

## 2021-07-22 ENCOUNTER — Ambulatory Visit: Payer: BC Managed Care – PPO | Admitting: Psychology

## 2021-07-22 ENCOUNTER — Ambulatory Visit (INDEPENDENT_AMBULATORY_CARE_PROVIDER_SITE_OTHER): Payer: BC Managed Care – PPO | Admitting: Psychology

## 2021-07-22 VITALS — BP 110/76 | HR 78 | Temp 97.3°F | Resp 12 | Ht 74.0 in | Wt 177.4 lb

## 2021-07-22 DIAGNOSIS — F411 Generalized anxiety disorder: Secondary | ICD-10-CM | POA: Diagnosis not present

## 2021-07-22 DIAGNOSIS — W57XXXA Bitten or stung by nonvenomous insect and other nonvenomous arthropods, initial encounter: Secondary | ICD-10-CM

## 2021-07-22 DIAGNOSIS — Z789 Other specified health status: Secondary | ICD-10-CM

## 2021-07-22 DIAGNOSIS — F321 Major depressive disorder, single episode, moderate: Secondary | ICD-10-CM | POA: Diagnosis not present

## 2021-07-22 DIAGNOSIS — R7401 Elevation of levels of liver transaminase levels: Secondary | ICD-10-CM | POA: Diagnosis not present

## 2021-07-22 DIAGNOSIS — S1086XA Insect bite of other specified part of neck, initial encounter: Secondary | ICD-10-CM | POA: Diagnosis not present

## 2021-07-22 DIAGNOSIS — F331 Major depressive disorder, recurrent, moderate: Secondary | ICD-10-CM | POA: Diagnosis not present

## 2021-07-22 MED ORDER — HYDROXYZINE PAMOATE 25 MG PO CAPS: 25.0000 mg | ORAL_CAPSULE | Freq: Two times a day (BID) | ORAL | 1 refills | Status: AC | PRN

## 2021-07-22 MED ORDER — SERTRALINE HCL 50 MG PO TABS: 50.0000 mg | ORAL_TABLET | Freq: Every day | ORAL | 5 refills | Status: DC

## 2021-07-22 NOTE — Progress Notes (Signed)
[place] [facility]  PROGRESS NOTE  Name: Wayne [name] Date: 07/22/2021 MRN: [id number] DOB: [birth year] PCP: [name], NP  Time spent: 3:00 - 3:55 AM  Today I met with  Wayne [name] in remote video (WebEx) face-to-face individual psychotherapy.  Distance Site: Client's Home Orginating Site: Dr [name] Remote Office Consent: Obtained verbal consent to transmit  session remotely    Reason for Visit /Presenting Problem:    Pt states that he recently discovered that his wife had an affair.  Two and a half weeks ago he made the discovery.  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  She states that she doesn't want to give up the affair relationship but she also wants to keep the same living arrangements.  Pt states that his wife shared that his mental health issues contributed to their marital problems.  He was able to accept his responsibility for the problems in the relationship.  Wayne [name] states that they both have had a history of indiscretions.  For as long as he can remember he has had depression.  He states that he at different points in his life he sought out treatment but he never stuck with it.      Diagnoses:  Major depressive disorder, recurrent episode, moderate (HCC)   Individualized Treatment Plan       Strengths: loves his family, hard worker  Supports: work friends   Goal/Needs for Treatment:  In order of importance to patient 1) Manage crisis and stabilize living situation 2) Facilitate decision making and provide guidance while making a determination about his marriage 3) Learn about the impact of depression and coping skills to manage depression   Client Statement of Needs: States he is in crisis after discovering his wife's affair.   Treatment Level: Outpatient Individual Psychotherapy  Symptoms:    GAD-7 = 20 (Severe Anxiety) feeling nervous, worrying too much and not able to control worry,  trouble relaxing, restless, and fearful of terrible things happening nearly every day.  Becoming easily annoyed over half the time.  PHQ-9 = 15 (Moderately Severe Depression) Feeling down and having trouble sleeping nearly everyday.  Little interest or pleasure in doing things, poor appetite, poor self esteem and trouble concentrating more than half the time.  Moving and speaking more slowly for several days in the past two weeks.    Client Treatment Preferences: needs a therapist with experience treating someone who has    Healthcare consumer's goal for treatment:  Psychologist, [name], [name] will support the patient's ability to achieve the goals identified. Cognitive Behavioral Therapy, Dialectical Behavioral Therapy, Motivational Interviewing, parent training, and other evidenced-based practices will be used to promote progress towards healthy functioning.   Healthcare consumer Wayne [name] will: Actively participate in therapy, working towards healthy functioning.    *Justification for Continuation/Discontinuation of Goal: R=Revised, O=Ongoing, A=Achieved, D=Discontinued  Goal 1) Manage crisis and stabilize living situation 5 Point Likert rating baseline date: Target Date Goal Was reviewed Status Code Progress towards goal/Likert rating  06/25/2022           O              Goal 2) Facilitate decision making and provide guidance while making a determination about his marriage 5 Point Likert rating baseline date: Target Date Goal Was reviewed Status Code Progress towards goal/Likert rating  06/25/2022           O  Goal 3) Learn about the impact of depression and coping skills to manage depression 5 Point Likert rating baseline date: Target Date Goal Was reviewed Status Code Progress towards goal/Likert rating  06/25/2022           O              This plan has been reviewed and created by the following participants:  This plan will be reviewed at least every 12  months. Date Behavioral Health Clinician Date Guardian/Patient   06/24/2021 [name], [name]   06/24/2021                     Wayne [name] reports that his wife took the weekend to think about the marriage.  She admitted that she spent to weekend with her lover.  She returned, they had a d/ and agreed to stay together.  We d/e/p what was talked about, how they came to an understanding and how they plan to move forward.  In the meantime, his wife's father is in the ICU after a cardiac event.  Her family dynamics are complicated and there has been a great deal of drama recently.  On a positive note, they have been able to work together and feel more united.    [name], PhD

## 2021-07-22 NOTE — Progress Notes (Signed)
Established Patient Office Visit  Subjective   Patient ID: Wayne [name], male    DOB: 12/05/1980  Age: 41 y.o. MRN: [id number]  Chief Complaint  Patient presents with   Depression    Follow up-better    Insect Bite    Noticed some kind of a bite mark from an insect on the left side of his neck 7/8 or 7/9-was swollen a lot. Red. Better now       Depression/Anxiety: Patient was seen on 06/10/2021 and dx with anxiety and depression. He was placed on zoloft and titrated up to 50mg  and referred to therapy. He was also prescribed hydroxyzine. He is here today for a follow up  States that he has been doing once a week therapy sessions. States that he has appointment and has been helpful States that he took the hydroxzine at the beginning and felt that it was helpful States with the zoloft he has experienced some sexual dysfunction and sweating. States that the burden of the side effects are diminshing.   States that he is having  good and bad days.  Did get an argument with his spouse that ended up with him in the emergency department with accusation of possible overdose.  Patient was cleared with no evidence of overdose or SI  States that him and his wife are working on things and working towards resolution.  States that she has made efforts in regards to blocking and cutting off Communication with person she was having affair with.     07/22/2021   11:38 AM 06/10/2021   12:35 PM 09/24/2019    9:21 AM  PHQ9 SCORE ONLY  PHQ-9 Total Score 18 17 22        07/22/2021   11:38 AM 06/10/2021   12:35 PM 09/24/2019    9:22 AM 07/12/2019   12:30 PM  GAD 7 : Generalized Anxiety Score  Nervous, Anxious, on Edge 2 2 2 3   Control/stop worrying 2 3 3 3   Worry too much - different things 2 3 3 3   Trouble relaxing 3 3 3 2   Restless 2 3 0 0  Easily annoyed or irritable 1 2 2 3   Afraid - awful might happen 2 3 1 2   Total GAD 7 Score 14 19 14 16   Anxiety Difficulty Extremely difficult Extremely  difficult Extremely difficult Extremely difficult      Bug bite: Noticed it on Saturday or Sunday. States that he is unsure if he noticed it when he got bit. Has been improving. States that it was swollen and reddness. No itching. No fever, chills     Review of Systems  Respiratory:  Negative for shortness of breath.   Skin:  Negative for itching.       "+" bite  Psychiatric/Behavioral:  Positive for depression.   All other systems reviewed and are negative.     Objective:     BP 110/76   Pulse 78   Temp (!) 97.3 F (36.3 C)   Resp 12   Ht 6\' 2"  (1.88 m)   Wt 177 lb 6 oz (80.5 kg)   SpO2 95%   BMI 22.77 kg/m    Physical Exam Vitals and nursing note reviewed.  Constitutional:      Appearance: Normal appearance.  Cardiovascular:     Rate and Rhythm: Normal rate and regular rhythm.     Heart sounds: Normal heart sounds.  Pulmonary:     Effort: Pulmonary effort is normal.  Breath sounds: Normal breath sounds.  Skin:    Findings: Erythema and lesion present.          Comments: Insect bite with appears to be hives on the left side of his neck  Neurological:     Mental Status: He is alert.  Psychiatric:        Mood and Affect: Mood normal.        Behavior: Behavior normal.        Thought Content: Thought content normal.        Judgment: Judgment normal.      No results found for any visits on 07/22/21.    The 10-year ASCVD risk score (Arnett DK, et al., 2019) is: 5.3%    Assessment & Plan:   Problem List Items Addressed This Visit       Other   Alcohol use    Decreased intake since last office visit.      Elevated ALT measurement    Patient has a history of elevated LFTs.  He was drinking but he has cut back since last time seen.  We will repeat labs today pending results      Relevant Orders   Hepatic function panel   GAD (generalized anxiety disorder)    Scores improved on GAD-7.  We will refill hydroxyzine for as needed use       Relevant Medications   hydrOXYzine (VISTARIL) 25 MG capsule   sertraline (ZOLOFT) 50 MG tablet   Depression, major, single episode, moderate (HCC)    Patient was placed on Zoloft 50 mg.  He is having a few adverse drug effects states are tolerable.  Does state that it feels like it is improving over this course and not reflected.  States he may have been up and down as of late and several acute events.  Patient denies HI/SI/AVH.  We will continue medications follow-up as directed sooner if needed      Relevant Medications   hydrOXYzine (VISTARIL) 25 MG capsule   sertraline (ZOLOFT) 50 MG tablet   Bug bite - Primary    Return in about 6 weeks (around 09/02/2021), or virtual, for 6 month cpe.    Audria Nine, NP

## 2021-07-22 NOTE — Assessment & Plan Note (Signed)
Patient was placed on Zoloft 50 mg.  He is having a few adverse drug effects states are tolerable.  Does state that it feels like it is improving over this course and not reflected.  States he may have been up and down as of late and several acute events.  Patient denies HI/SI/AVH.  We will continue medications follow-up as directed sooner if needed

## 2021-07-22 NOTE — Assessment & Plan Note (Signed)
Decreased intake since last office visit.

## 2021-07-22 NOTE — Assessment & Plan Note (Signed)
Scores improved on GAD-7.  We will refill hydroxyzine for as needed use

## 2021-07-22 NOTE — Patient Instructions (Addendum)
Nice to see you today Follow up with me virtually in about 6 weeks See me in person in around 6 months for you physical Continue going to therapy  You can use some over the counter hydrocortisone cream on the neck to help it go away

## 2021-07-22 NOTE — Assessment & Plan Note (Signed)
Patient has a history of elevated LFTs.  He was drinking but he has cut back since last time seen.  We will repeat labs today pending results

## 2021-07-23 LAB — HEPATIC FUNCTION PANEL
ALT: 24 IU/L (ref 0–44)
AST: 13 IU/L (ref 0–40)
Albumin: 4.6 g/dL (ref 4.1–5.1)
Alkaline Phosphatase: 74 IU/L (ref 44–121)
Bilirubin Total: 0.4 mg/dL (ref 0.0–1.2)
Bilirubin, Direct: <0.1 mg/dL (ref 0.00–0.40)
Total Protein: 6.6 g/dL (ref 6.0–8.5)

## 2021-07-27 ENCOUNTER — Telehealth: Payer: Self-pay

## 2021-07-27 ENCOUNTER — Telehealth: Payer: Self-pay | Admitting: Nurse Practitioner

## 2021-07-27 ENCOUNTER — Encounter: Payer: Self-pay | Admitting: Nurse Practitioner

## 2021-07-27 ENCOUNTER — Ambulatory Visit (INDEPENDENT_AMBULATORY_CARE_PROVIDER_SITE_OTHER): Payer: BC Managed Care – PPO | Admitting: Psychology

## 2021-07-27 DIAGNOSIS — F331 Major depressive disorder, recurrent, moderate: Secondary | ICD-10-CM

## 2021-07-27 NOTE — Telephone Encounter (Signed)
Received FMLA for patient's work-spoke with patient to get some of the information needed for the form. Patient was out of work 06/08/21-06/22/21, he did miss a 1 to 2 days since then of work. His symptoms were anxiety, depression, easily distracted, trouble focusing and therefore had issues with interacting with co workers. He would get about 6 hours of sleep every 2 days. Had trouble with managing his home and going on routine grocery shopping tasks due to anxiety and trouble with focusing which lead to been overwhelmed.  FMLA form filled out and faxed today to [employer] [phone]

## 2021-07-27 NOTE — Progress Notes (Addendum)
PROGRESS NOTE  Name: Wayne [name] Date: 07/27/2021 MRN: [id number] DOB: 10-13-1980 PCP: [name], NP  Time spent: 9:00 - 9:55 AM  Today I met with  Wayne [name] in remote video (WebEx) face-to-face individual psychotherapy.  Distance Site: Client's Home Orginating Site: Dr [name] Remote Office Consent: Obtained verbal consent to transmit  session remotely    Presenting Problem:    Pt states that he recently discovered that his wife had an affair.  Two and a half weeks ago he made the discovery.  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  She states that she doesn't want to give up the affair relationship but she also wants to keep the same living arrangements.  Pt states that his wife shared that his mental health issues contributed to their marital problems.  He was able to accept his responsibility for the problems in the relationship.  Wayne [name] states that they both have had a history of indiscretions.  For as long as he can remember he has had depression.  He states that he at different points in his life he sought out treatment but he never stuck with it.      Diagnoses:  Major depressive disorder, recurrent episode, moderate (HCC)   Individualized Treatment Plan       Strengths: loves his family, hard worker  Supports: work friends   Goal/Needs for Treatment:  In order of importance to patient 1) Manage crisis and stabilize living situation 2) Facilitate decision making and provide guidance while making a determination about his marriage 3) Learn about the impact of depression and coping skills to manage depression   Client Statement of Needs: States he is in crisis after discovering his wife's affair.   Treatment Level: Outpatient Individual Psychotherapy  Symptoms:    GAD-7 = 20 (Severe Anxiety) feeling nervous, worrying too much and not able to control worry, trouble relaxing, restless, and fearful  of terrible things happening nearly every day.  Becoming easily annoyed over half the time.  PHQ-9 = 15 (Moderately Severe Depression) Feeling down and having trouble sleeping nearly everyday.  Little interest or pleasure in doing things, poor appetite, poor self esteem and trouble concentrating more than half the time.  Moving and speaking more slowly for several days in the past two weeks.    Client Treatment Preferences: needs a therapist with experience treating someone who has    Healthcare consumer's goal for treatment:  Psychologist, [name], [name] will support the patient's ability to achieve the goals identified. Cognitive Behavioral Therapy, Dialectical Behavioral Therapy, Motivational Interviewing, parent training, and other evidenced-based practices will be used to promote progress towards healthy functioning.   Healthcare consumer Wayne [name] will: Actively participate in therapy, working towards healthy functioning.    *Justification for Continuation/Discontinuation of Goal: R=Revised, O=Ongoing, A=Achieved, D=Discontinued  Goal 1) Manage crisis and stabilize living situation 5 Point Likert rating baseline date: Target Date Goal Was reviewed Status Code Progress towards goal/Likert rating  06/25/2022           O              Goal 2) Facilitate decision making and provide guidance while making a determination about his marriage 5 Point Likert rating baseline date: Target Date Goal Was reviewed Status Code Progress towards goal/Likert rating  06/25/2022           O  Goal 3) Learn about the impact of depression and coping skills to manage depression 5 Point Likert rating baseline date: Target Date Goal Was reviewed Status Code Progress towards goal/Likert rating  06/25/2022           O              This plan has been reviewed and created by the following participants:  This plan will be reviewed at least every 12 months. Date Behavioral Health Clinician  Date Guardian/Patient   06/24/2021 [name], [name]   06/24/2021 Wayne [name]                    Depression Rating: 7   Wayne [name] reports that his wife is having a tough time with the loss of her father.  We d/e/p the complicated family dynamics in play, how he practice engagement and how to provide support.   [name], PhD

## 2021-07-27 NOTE — Telephone Encounter (Signed)
Corrie Dandy,  We have a mutual patient Wayne [name]. He mentioned about escalating his Zoloft dosing to around 150mg  daily. I am unable to see your notes in epic but wanted to hear your suggestions and feed back.  Thanks, 

## 2021-08-04 ENCOUNTER — Ambulatory Visit (INDEPENDENT_AMBULATORY_CARE_PROVIDER_SITE_OTHER): Payer: BC Managed Care – PPO | Admitting: Psychology

## 2021-08-04 DIAGNOSIS — F331 Major depressive disorder, recurrent, moderate: Secondary | ICD-10-CM

## 2021-08-04 NOTE — Progress Notes (Signed)
PROGRESS NOTE  Name: Wayne [name] Date: 08/04/2021 MRN: [id number] DOB: 27-Oct-1980 PCP: Wayne Pitcher, NP  Time spent: 9:00 - 9:55 AM  Today I met with  Wayne [name] in remote video (WebEx) face-to-face individual psychotherapy.  Distance Site: Client's Home Orginating Site: Dr Wayne [name] Remote Office Consent: Obtained verbal consent to transmit session remotely    Presenting Problem:    Pt states that he recently discovered that his wife had an affair.  Two and a half weeks ago he made the discovery.  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  She states that she doesn't want to give up the affair relationship but she also wants to keep the same living arrangements.  Pt states that his wife shared that his mental health issues contributed to their marital problems.  He was able to accept his responsibility for the problems in the relationship.  Wayne [name] states that they both have had a history of indiscretions.  For as long as he can remember he has had depression.  He states that he at different points in his life he sought out treatment but he never stuck with it.      Diagnoses:  Major depressive disorder, recurrent episode, moderate (HCC)   Individualized Treatment Plan       Strengths: loves his family, hard worker  Supports: work friends   Goal/Needs for Treatment:  In order of importance to patient 1) Manage crisis and stabilize living situation 2) Facilitate decision making and provide guidance while making a determination about his marriage 3) Learn about the impact of depression and coping skills to manage depression   Client Statement of Needs: States he is in crisis after discovering his wife's affair.   Treatment Level: Outpatient Individual Psychotherapy  Symptoms:    GAD-7 = 20 (Severe Anxiety) feeling nervous, worrying too much and not able to control worry, trouble relaxing, restless, and fearful of  terrible things happening nearly every day.  Becoming easily annoyed over half the time.  PHQ-9 = 15 (Moderately Severe Depression) Feeling down and having trouble sleeping nearly everyday.  Little interest or pleasure in doing things, poor appetite, poor self esteem and trouble concentrating more than half the time.  Moving and speaking more slowly for several days in the past two weeks.    Client Treatment Preferences: needs a therapist with experience treating someone who has    Healthcare consumer's goal for treatment:  Psychologist, Wayne [name], [name] will support the patient's ability to achieve the goals identified. Cognitive Behavioral Therapy, Dialectical Behavioral Therapy, Motivational Interviewing, parent training, and other evidenced-based practices will be used to promote progress towards healthy functioning.   Healthcare consumer Wayne [name] will: Actively participate in therapy, working towards healthy functioning.    *Justification for Continuation/Discontinuation of Goal: R=Revised, O=Ongoing, A=Achieved, D=Discontinued  Goal 1) Manage crisis and stabilize living situation 5 Point Likert rating baseline date: Target Date Goal Was reviewed Status Code Progress towards goal/Likert rating  06/25/2022           O              Goal 2) Facilitate decision making and provide guidance while making a determination about his marriage 5 Point Likert rating baseline date: Target Date Goal Was reviewed Status Code Progress towards goal/Likert rating  06/25/2022           O  Goal 3) Learn about the impact of depression and coping skills to manage depression 5 Point Likert rating baseline date: Target Date Goal Was reviewed Status Code Progress towards goal/Likert rating  06/25/2022           O              This plan has been reviewed and created by the following participants:  This plan will be reviewed at least every 12 months. Date Behavioral Health Clinician  Date Guardian/Patient   06/24/2021 Wayne [name], [name]   06/24/2021 Wayne [name]                    Depression Rating: 6-7   Wayne [name] reports that his father-in-law's funeral was this weekend.  It was a difficult time.  We d/e/p the complicated family dynamics in play, the ways he remained engaged and how he provided his wife support.  Wayne [name] stated that the man his wife was involved with has moved into town and she has spent some time with him.  We d/e/p the event, how he was feeling, thinking and how to proceed.   Wayne Crochet, PhD

## 2021-08-06 ENCOUNTER — Telehealth: Payer: Self-pay

## 2021-08-06 NOTE — Telephone Encounter (Signed)
We received an additional short term disability form for Mr Bugge. This is an additional form that is regarding the same time frame that Mr Scalise was out of work, from 06/03/21 - 06/16/2021.  I spoke to [place], who said this is a different form from the one that was filled out on 07/27/21.  I placed form in your inbox for completion and signing.

## 2021-08-07 NOTE — Telephone Encounter (Signed)
Completed form faxed to F# [phone].  Received fax confirmation.  I made a copy for patient, scanning, and myself.  Per patient request, I am mailing out his copy to home address.

## 2021-08-07 NOTE — Telephone Encounter (Signed)
Form completed.

## 2021-08-10 ENCOUNTER — Ambulatory Visit (INDEPENDENT_AMBULATORY_CARE_PROVIDER_SITE_OTHER): Payer: BC Managed Care – PPO | Admitting: Psychology

## 2021-08-10 DIAGNOSIS — F331 Major depressive disorder, recurrent, moderate: Secondary | ICD-10-CM | POA: Diagnosis not present

## 2021-08-10 NOTE — Progress Notes (Signed)
PROGRESS NOTE  Name: Wayne [name] Date: 08/10/2021 MRN: [id number] DOB: 1980-05-11 PCP: [name], NP  Time spent: 9:00 - 9:55 AM  Today I met with  Wayne [name] in remote video (WebEx) face-to-face individual psychotherapy.  Distance Site: Client's Home Orginating Site: Dr [name] Remote Office Consent: Obtained verbal consent to transmit session remotely    Presenting Problem:    Pt states that he recently discovered that his wife had an affair.  Two and a half weeks ago he made the discovery.  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  She states that she doesn't want to give up the affair relationship but she also wants to keep the same living arrangements.  Pt states that his wife shared that his mental health issues contributed to their marital problems.  He was able to accept his responsibility for the problems in the relationship.  Wayne [name] states that they both have had a history of indiscretions.  For as long as he can remember he has had depression.  He states that he at different points in his life he sought out treatment but he never stuck with it.      Diagnoses:  No diagnosis found.   Individualized Treatment Plan       Strengths: loves his family, hard worker  Supports: work friends   Goal/Needs for Treatment:  In order of importance to patient 1) Manage crisis and stabilize living situation 2) Facilitate decision making and provide guidance while making a determination about his marriage 3) Learn about the impact of depression and coping skills to manage depression   Client Statement of Needs: States he is in crisis after discovering his wife's affair.   Treatment Level: Outpatient Individual Psychotherapy  Symptoms:    GAD-7 = 20 (Severe Anxiety) feeling nervous, worrying too much and not able to control worry, trouble relaxing, restless, and fearful of terrible things happening nearly every  day.  Becoming easily annoyed over half the time.  PHQ-9 = 15 (Moderately Severe Depression) Feeling down and having trouble sleeping nearly everyday.  Little interest or pleasure in doing things, poor appetite, poor self esteem and trouble concentrating more than half the time.  Moving and speaking more slowly for several days in the past two weeks.    Client Treatment Preferences: needs a therapist with experience treating someone who has    Healthcare consumer's goal for treatment:  Psychologist, Wayne [name], [name] will support the patient's ability to achieve the goals identified. Cognitive Behavioral Therapy, Dialectical Behavioral Therapy, Motivational Interviewing, parent training, and other evidenced-based practices will be used to promote progress towards healthy functioning.   Healthcare consumer Wayne [name] will: Actively participate in therapy, working towards healthy functioning.    *Justification for Continuation/Discontinuation of Goal: R=Revised, O=Ongoing, A=Achieved, D=Discontinued  Goal 1) Manage crisis and stabilize living situation 5 Point Likert rating baseline date: Target Date Goal Was reviewed Status Code Progress towards goal/Likert rating  06/25/2022           O              Goal 2) Facilitate decision making and provide guidance while making a determination about his marriage 5 Point Likert rating baseline date: Target Date Goal Was reviewed Status Code Progress towards goal/Likert rating  06/25/2022           O  Goal 3) Learn about the impact of depression and coping skills to manage depression 5 Point Likert rating baseline date: Target Date Goal Was reviewed Status Code Progress towards goal/Likert rating  06/25/2022           O              This plan has been reviewed and created by the following participants:  This plan will be reviewed at least every 12 months. Date Behavioral Health Clinician Date Guardian/Patient   06/24/2021 Wayne [name], [name]   06/24/2021 Wayne [name]                    Depression Rating: 6-7   Wayne [name] reports that this was the first week he and his wife's work schedule were similar.  We d/ the ways in which this was helpful for their home life and marriage.  I encouraged him to work on improving him communication and suggestions for how to achieve this.    We also d/ that his medication has certain side effects that I encouraged him to speak to his PCP about.  We d/ that Wellbutrin might be a good choice for several reasons which he could d/ with his PCP.   Wayne Crochet, PhD

## 2021-08-12 ENCOUNTER — Other Ambulatory Visit: Payer: Self-pay | Admitting: Nurse Practitioner

## 2021-08-12 DIAGNOSIS — F339 Major depressive disorder, recurrent, unspecified: Secondary | ICD-10-CM

## 2021-08-12 MED ORDER — SERTRALINE HCL 100 MG PO TABS: 100.0000 mg | ORAL_TABLET | Freq: Every day | ORAL | 3 refills | Status: DC

## 2021-08-12 NOTE — Progress Notes (Signed)
Orders only

## 2021-08-17 ENCOUNTER — Ambulatory Visit (INDEPENDENT_AMBULATORY_CARE_PROVIDER_SITE_OTHER): Payer: BC Managed Care – PPO | Admitting: Psychology

## 2021-08-17 DIAGNOSIS — F331 Major depressive disorder, recurrent, moderate: Secondary | ICD-10-CM | POA: Diagnosis not present

## 2021-08-17 NOTE — Progress Notes (Signed)
PROGRESS NOTE  Name: Wayne [name] Date: 08/17/2021 MRN: [id number] DOB: 05-13-1980 PCP: [name], NP  Time spent: 1:00 - 1:55 PM  Today I met with  Tana Conch Etheridge in remote video (WebEx) face-to-face individual psychotherapy.  Distance Site: Client's Home Orginating Site: Dr [name] Remote Office Consent: Obtained verbal consent to transmit session remotely    Presenting Problem:    Pt states that he recently discovered that his wife had an affair.  Two and a half weeks ago he made the discovery.  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  She states that she doesn't want to give up the affair relationship but she also wants to keep the same living arrangements.  Pt states that his wife shared that his mental health issues contributed to their marital problems.  He was able to accept his responsibility for the problems in the relationship.  Delores states that they both have had a history of indiscretions.  For as long as he can remember he has had depression.  He states that he at different points in his life he sought out treatment but he never stuck with it.      Diagnoses:  Major depressive disorder, recurrent episode, moderate (HCC)   Individualized Treatment Plan       Strengths: loves his family, hard worker  Supports: work friends   Goal/Needs for Treatment:  In order of importance to patient 1) Manage crisis and stabilize living situation 2) Facilitate decision making and provide guidance while making a determination about his marriage 3) Learn about the impact of depression and coping skills to manage depression   Client Statement of Needs: States he is in crisis after discovering his wife's affair.   Treatment Level: Outpatient Individual Psychotherapy  Symptoms:    GAD-7 = 20 (Severe Anxiety) feeling nervous, worrying too much and not able to control worry, trouble relaxing, restless, and fearful of  terrible things happening nearly every day.  Becoming easily annoyed over half the time.  PHQ-9 = 15 (Moderately Severe Depression) Feeling down and having trouble sleeping nearly everyday.  Little interest or pleasure in doing things, poor appetite, poor self esteem and trouble concentrating more than half the time.  Moving and speaking more slowly for several days in the past two weeks.    Client Treatment Preferences: needs a therapist with experience treating someone who has    Healthcare consumer's goal for treatment:  Psychologist, [name], [name] will support the patient's ability to achieve the goals identified. Cognitive Behavioral Therapy, Dialectical Behavioral Therapy, Motivational Interviewing, parent training, and other evidenced-based practices will be used to promote progress towards healthy functioning.   Healthcare consumer Aysen Nakatani will: Actively participate in therapy, working towards healthy functioning.    *Justification for Continuation/Discontinuation of Goal: R=Revised, O=Ongoing, A=Achieved, D=Discontinued  Goal 1) Manage crisis and stabilize living situation 5 Point Likert rating baseline date: Target Date Goal Was reviewed Status Code Progress towards goal/Likert rating  06/25/2022           O              Goal 2) Facilitate decision making and provide guidance while making a determination about his marriage 5 Point Likert rating baseline date: Target Date Goal Was reviewed Status Code Progress towards goal/Likert rating  06/25/2022           O  Goal 3) Learn about the impact of depression and coping skills to manage depression 5 Point Likert rating baseline date: Target Date Goal Was reviewed Status Code Progress towards goal/Likert rating  06/25/2022           O              This plan has been reviewed and created by the following participants:  This plan will be reviewed at least every 12 months. Date Behavioral Health Clinician  Date Guardian/Patient   06/24/2021 [name], [name]   06/24/2021 Asar Plant                    Depression Rating: 6-7   Idrissa reports that things went roughly this past week.  His wife stated that her mother told her to leave him.  We d/e/p what occurred, how he was feeling about knowing that his wife was still meeting with the person of the affair.  We also d/e/p some of the factors that made their marriage vulnerable to affairs.  I emphasized that importance to speaking directly with his wife about the current threats to the mending of their marriage and of the need for couple's therapy.  If she is still resistant (which I would expect) then I suggested he offer to go through the program outlined in the Getting Past the Affair workbook together.    I will need to f/u whether Duard spoke to his PCP about switching to Wellbutrin.  Home Practice:  purchase workbook   [name], PhD

## 2021-08-24 ENCOUNTER — Ambulatory Visit (INDEPENDENT_AMBULATORY_CARE_PROVIDER_SITE_OTHER): Payer: BC Managed Care – PPO | Admitting: Psychology

## 2021-08-24 ENCOUNTER — Ambulatory Visit: Payer: BC Managed Care – PPO | Admitting: Psychology

## 2021-08-24 DIAGNOSIS — F331 Major depressive disorder, recurrent, moderate: Secondary | ICD-10-CM | POA: Diagnosis not present

## 2021-08-24 NOTE — Progress Notes (Signed)
PROGRESS NOTE  Name: Wayne [name] Date: 08/24/2021 MRN: [id number] DOB: Sep 05, 1980 PCP: Wayne Pitcher, NP  Time spent: 10:00 - 10:55 AM  Today I met with  Wayne [name] in remote video (WebEx) face-to-face individual psychotherapy.  Distance Site: Client's Home Orginating Site: Dr [name] Remote Office Consent: Obtained verbal consent to transmit session remotely    Presenting Problem:    Pt states that he recently discovered that his wife had an affair.  Two and a half weeks ago he made the discovery.  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  She states that she doesn't want to give up the affair relationship but she also wants to keep the same living arrangements.  Pt states that his wife shared that his mental health issues contributed to their marital problems.  He was able to accept his responsibility for the problems in the relationship.  Wayne [name] states that they both have had a history of indiscretions.  For as long as he can remember he has had depression.  He states that he at different points in his life he sought out treatment but he never stuck with it.      Diagnoses:  Major depressive disorder, recurrent episode, moderate (HCC)   Individualized Treatment Plan       Strengths: loves his family, hard worker  Supports: work friends   Goal/Needs for Treatment:  In order of importance to patient 1) Manage crisis and stabilize living situation 2) Facilitate decision making and provide guidance while making a determination about his marriage 3) Learn about the impact of depression and coping skills to manage depression   Client Statement of Needs: States he is in crisis after discovering his wife's affair.   Treatment Level: Outpatient Individual Psychotherapy  Symptoms:    GAD-7 = 20 (Severe Anxiety) feeling nervous, worrying too much and not able to control worry, trouble relaxing, restless, and fearful  of terrible things happening nearly every day.  Becoming easily annoyed over half the time.  PHQ-9 = 15 (Moderately Severe Depression) Feeling down and having trouble sleeping nearly everyday.  Little interest or pleasure in doing things, poor appetite, poor self esteem and trouble concentrating more than half the time.  Moving and speaking more slowly for several days in the past two weeks.    Client Treatment Preferences: needs a therapist with experience treating someone who has    Healthcare consumer's goal for treatment:  Psychologist, Wayne [name], [name] will support the patient's ability to achieve the goals identified. Cognitive Behavioral Therapy, Dialectical Behavioral Therapy, Motivational Interviewing, parent training, and other evidenced-based practices will be used to promote progress towards healthy functioning.   Healthcare consumer Wayne [name] will: Actively participate in therapy, working towards healthy functioning.    *Justification for Continuation/Discontinuation of Goal: R=Revised, O=Ongoing, A=Achieved, D=Discontinued  Goal 1) Manage crisis and stabilize living situation 5 Point Likert rating baseline date: Target Date Goal Was reviewed Status Code Progress towards goal/Likert rating  06/25/2022           O              Goal 2) Facilitate decision making and provide guidance while making a determination about his marriage 5 Point Likert rating baseline date: Target Date Goal Was reviewed Status Code Progress towards goal/Likert rating  06/25/2022           O  Goal 3) Learn about the impact of depression and coping skills to manage depression 5 Point Likert rating baseline date: Target Date Goal Was reviewed Status Code Progress towards goal/Likert rating  06/25/2022           O              This plan has been reviewed and created by the following participants:  This plan will be reviewed at least every 12 months. Date Behavioral Health Clinician  Date Guardian/Patient   06/24/2021 Wayne [name], [name]   06/24/2021 Wayne [name]                    Depression Rating: 6-7   Marquiz reports that he purchased the Getting Past the [employer] but will be working on it on his own.  He shared that he has attempted to provide what his wife said she needs and it was not received well.  I gently challenged Seab on this fact.  I noted that his ideas were really more self serving and more about his monitoring his wife's behavior and not really about give her more attention.  I encouraged him to set aside some time with his wife to talk about what it is they both actually need from one another.  I shared a worksheet to from [facility] [employer] to give them some guidance and help keep them on track.  Sael agreed to speak to his wife.    Home Practice:  purchase workbook   Wayne Crochet, PhD

## 2021-08-31 ENCOUNTER — Ambulatory Visit (INDEPENDENT_AMBULATORY_CARE_PROVIDER_SITE_OTHER): Payer: BC Managed Care – PPO | Admitting: Psychology

## 2021-08-31 DIAGNOSIS — F330 Major depressive disorder, recurrent, mild: Secondary | ICD-10-CM

## 2021-08-31 NOTE — Progress Notes (Signed)
PROGRESS NOTE  Name: Coreon Simkins Moris Date: 08/31/2021 MRN: [id number] DOB: 1980/10/03 PCP: [name], NP  Time spent: 10:00 - 10:55 AM  Today I met with  Tana Conch [place] in remote video (WebEx) face-to-face individual psychotherapy.  Distance Site: Client's Home Orginating Site: Dr [name] Remote Office Consent: Obtained verbal consent to transmit session remotely    Presenting Problem:    Pt states that he recently discovered that his wife had an affair.  Two and a half weeks ago he made the discovery.  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  She states that she doesn't want to give up the affair relationship but she also wants to keep the same living arrangements.  Pt states that his wife shared that his mental health issues contributed to their marital problems.  He was able to accept his responsibility for the problems in the relationship.  Gualberto states that they both have had a history of indiscretions.  For as long as he can remember he has had depression.  He states that he at different points in his life he sought out treatment but he never stuck with it.      Diagnoses:  Depression, major, recurrent, mild ([place])   Individualized Treatment Plan       Strengths: loves his family, hard worker  Supports: work friends   Goal/Needs for Treatment:  In order of importance to patient 1) Manage crisis and stabilize living situation 2) Facilitate decision making and provide guidance while making a determination about his marriage 3) Learn about the impact of depression and coping skills to manage depression   Client Statement of Needs: States he is in crisis after discovering his wife's affair.   Treatment Level: Outpatient Individual Psychotherapy  Symptoms:    GAD-7 = 20 (Severe Anxiety) feeling nervous, worrying too much and not able to control worry, trouble relaxing, restless, and fearful of terrible things  happening nearly every day.  Becoming easily annoyed over half the time.  PHQ-9 = 15 (Moderately Severe Depression) Feeling down and having trouble sleeping nearly everyday.  Little interest or pleasure in doing things, poor appetite, poor self esteem and trouble concentrating more than half the time.  Moving and speaking more slowly for several days in the past two weeks.    Client Treatment Preferences: needs a therapist with experience treating someone who has    Healthcare consumer's goal for treatment:  Psychologist, [name], [name] will support the patient's ability to achieve the goals identified. Cognitive Behavioral Therapy, Dialectical Behavioral Therapy, Motivational Interviewing, parent training, and other evidenced-based practices will be used to promote progress towards healthy functioning.   Healthcare consumer Creedence Mendiola will: Actively participate in therapy, working towards healthy functioning.    *Justification for Continuation/Discontinuation of Goal: R=Revised, O=Ongoing, A=Achieved, D=Discontinued  Goal 1) Manage crisis and stabilize living situation 5 Point Likert rating baseline date: Target Date Goal Was reviewed Status Code Progress towards goal/Likert rating  06/25/2022           O              Goal 2) Facilitate decision making and provide guidance while making a determination about his marriage 5 Point Likert rating baseline date: Target Date Goal Was reviewed Status Code Progress towards goal/Likert rating  06/25/2022           O  Goal 3) Learn about the impact of depression and coping skills to manage depression 5 Point Likert rating baseline date: Target Date Goal Was reviewed Status Code Progress towards goal/Likert rating  06/25/2022           O              This plan has been reviewed and created by the following participants:  This plan will be reviewed at least every 12 months. Date Behavioral Health Clinician Date  Guardian/Patient   06/24/2021 [name], [name]   06/24/2021 Jahlon Luzier                    Depression Rating: 6-7   Waylyn reports that he was able to have a good conversation with his wife.  However, as we talked it became clear that little was resolved.  I continued to urge Yosiah to insist on clearer communication and we d/e/ why he didn't push for clarification.  I identified that his anxious self talk was interfering with his pressing for limits and asking for what he needed to rebuild trust.  I referred him back to the workbook, asked him to once again invite his wife to work on it together if she refused to go to therapy.    Home Practice:  complete prescribed pages in [place], PhD

## 2021-09-03 ENCOUNTER — Encounter: Payer: Self-pay | Admitting: Nurse Practitioner

## 2021-09-03 NOTE — Telephone Encounter (Signed)
needs office visit

## 2021-09-07 ENCOUNTER — Ambulatory Visit (INDEPENDENT_AMBULATORY_CARE_PROVIDER_SITE_OTHER): Payer: BC Managed Care – PPO | Admitting: Psychology

## 2021-09-07 ENCOUNTER — Ambulatory Visit: Payer: BC Managed Care – PPO | Admitting: Psychology

## 2021-09-07 DIAGNOSIS — F330 Major depressive disorder, recurrent, mild: Secondary | ICD-10-CM

## 2021-09-07 NOTE — Progress Notes (Signed)
PROGRESS NOTE  Name: Wayne [name] Date: 09/07/2021 MRN: [id number] DOB: 12/30/1980 PCP: Wayne [name], Wayne [name]  Time spent: 10:00 - 10:55 AM  Today I met with  Wayne [name] in remote video (WebEx) face-to-face individual psychotherapy.  Distance Site: Client's Home Orginating Site: Dr [name] Remote Office Consent: Obtained verbal consent to transmit session remotely    Presenting Problem:    Pt states that he recently discovered that his wife had an affair.  Two and a half weeks ago he made the discovery.  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  She states that she doesn't want to give up the affair relationship but she also wants to keep the same living arrangements.  Pt states that his wife shared that his mental health issues contributed to their marital problems.  He was able to accept his responsibility for the problems in the relationship.  Wayne [name] states that they both have had a history of indiscretions.  For as long as he can remember he has had depression.  He states that he at different points in his life he sought out treatment but he never stuck with it.      Diagnoses:  Depression, major, recurrent, mild (Wayne [name])   Individualized Treatment Plan       Strengths: loves his family, hard worker  Supports: work friends   Goal/Needs for Treatment:  In order of importance to patient 1) Manage crisis and stabilize living situation 2) Facilitate decision making and provide guidance while making a determination about his marriage 3) Learn about the impact of depression and coping skills to manage depression   Client Statement of Needs: States he is in crisis after discovering his wife's affair.   Treatment Level: Outpatient Individual Psychotherapy  Symptoms:    GAD-7 = 20 (Severe Anxiety) feeling nervous, worrying too much and not able to control worry, trouble relaxing, restless, and fearful of terrible things  happening nearly every day.  Becoming easily annoyed over half the time.  PHQ-9 = 15 (Moderately Severe Depression) Feeling down and having trouble sleeping nearly everyday.  Little interest or pleasure in doing things, poor appetite, poor self esteem and trouble concentrating more than half the time.  Moving and speaking more slowly for several days in the past two weeks.    Client Treatment Preferences: needs a therapist with experience treating someone who has    Healthcare consumer's goal for treatment:  Wayne [name], Wayne [name], [name] will support the patient's ability to achieve the goals identified. Cognitive Behavioral Therapy, Dialectical Behavioral Therapy, Motivational Interviewing, parent training, and other evidenced-based practices will be used to promote progress towards healthy functioning.   Healthcare consumer Wayne [name] will: Actively participate in therapy, working towards healthy functioning.    *Justification for Continuation/Discontinuation of Goal: R=Revised, O=Ongoing, A=Achieved, D=Discontinued  Goal 1) Manage crisis and stabilize living situation 5 Point Likert rating baseline date: Target Date Goal Was reviewed Status Code Progress towards goal/Likert rating  06/25/2022           O              Goal 2) Facilitate decision making and provide guidance while making a determination about his marriage 5 Point Likert rating baseline date: Target Date Goal Was reviewed Status Code Progress towards goal/Likert rating  06/25/2022           O  Goal 3) Learn about the impact of depression and coping skills to manage depression 5 Point Likert rating baseline date: Target Date Goal Was reviewed Status Code Progress towards goal/Likert rating  06/25/2022           O              This plan has been reviewed and created by the following participants:  This plan will be reviewed at least every 12 months. Date Behavioral Health Clinician Date  Guardian/Patient   06/24/2021 Wayne [name], [name]   06/24/2021 Wayne [name]                    Depression Rating: 6-7   Wayne [name] reports he spoke with his wife about needing to show that she is trying to make a commitment to the relationship.  Meanwhile, she continues to give him double messages.  I noted that Wayne [name] is anxious about asking for clarity and is afraid  she will leave him if he pushes her to make changes.  We d/e/p that if they are not able to make the changes they are very likely to repeat this pattern.  I gave him some points to consider as he is trying to decide whether she is able to make the changes necessary to make the relationship work.  I gave him some sections to read in the workbook to help him with this process.   Home Practice:  complete prescribed pages in Wayne [name], Wayne [name]

## 2021-09-21 ENCOUNTER — Ambulatory Visit: Payer: BC Managed Care – PPO | Admitting: Psychology

## 2021-09-21 ENCOUNTER — Ambulatory Visit (INDEPENDENT_AMBULATORY_CARE_PROVIDER_SITE_OTHER): Payer: BC Managed Care – PPO | Admitting: Psychology

## 2021-09-21 DIAGNOSIS — F330 Major depressive disorder, recurrent, mild: Secondary | ICD-10-CM

## 2021-09-21 NOTE — Progress Notes (Signed)
ROGRESS NOTE  Name: Wayne [name] Date: 09/21/2021 MRN: [id number] DOB: 09-02-1980 PCP: [name], NP  Time spent: 10:00 - 10:55 AM  Today I met with  Wayne [name] in remote video (WebEx) face-to-face individual psychotherapy.  Distance Site: Client's Home Orginating Site: Dr [name] Remote Office Consent: Obtained verbal consent to transmit session remotely    Presenting Problem:    Pt states that he recently discovered that his wife had an affair.  Two and a half weeks ago he made the discovery.  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  She states that she doesn't want to give up the affair relationship but she also wants to keep the same living arrangements.  Pt states that his wife shared that his mental health issues contributed to their marital problems.  He was able to accept his responsibility for the problems in the relationship.  Wayne [name] states that they both have had a history of indiscretions.  For as long as he can remember he has had depression.  He states that he at different points in his life he sought out treatment but he never stuck with it.      Diagnoses:  Depression, major, recurrent, mild ([place])   Individualized Treatment Plan       Strengths: loves his family, hard worker  Supports: work friends   Goal/Needs for Treatment:  In order of importance to patient 1) Manage crisis and stabilize living situation 2) Facilitate decision making and provide guidance while making a determination about his marriage 3) Learn about the impact of depression and coping skills to manage depression   Client Statement of Needs: States he is in crisis after discovering his wife's affair.   Treatment Level: Outpatient Individual Psychotherapy  Symptoms:    GAD-7 = 20 (Severe Anxiety) feeling nervous, worrying too much and not able to control worry, trouble relaxing, restless, and fearful of terrible things  happening nearly every day.  Becoming easily annoyed over half the time.  PHQ-9 = 15 (Moderately Severe Depression) Feeling down and having trouble sleeping nearly everyday.  Little interest or pleasure in doing things, poor appetite, poor self esteem and trouble concentrating more than half the time.  Moving and speaking more slowly for several days in the past two weeks.    Client Treatment Preferences: needs a therapist with experience treating someone who has    Healthcare consumer's goal for treatment:  Psychologist, Wayne [name], [name] will support the patient's ability to achieve the goals identified. Cognitive Behavioral Therapy, Dialectical Behavioral Therapy, Motivational Interviewing, parent training, and other evidenced-based practices will be used to promote progress towards healthy functioning.   Healthcare consumer Wayne [name] will: Actively participate in therapy, working towards healthy functioning.    *Justification for Continuation/Discontinuation of Goal: R=Revised, O=Ongoing, A=Achieved, D=Discontinued  Goal 1) Manage crisis and stabilize living situation 5 Point Likert rating baseline date: Target Date Goal Was reviewed Status Code Progress towards goal/Likert rating  06/25/2022           O              Goal 2) Facilitate decision making and provide guidance while making a determination about his marriage 5 Point Likert rating baseline date: Target Date Goal Was reviewed Status Code Progress towards goal/Likert rating  06/25/2022           O  Goal 3) Learn about the impact of depression and coping skills to manage depression 5 Point Likert rating baseline date: Target Date Goal Was reviewed Status Code Progress towards goal/Likert rating  06/25/2022           O              This plan has been reviewed and created by the following participants:  This plan will be reviewed at least every 12 months. Date Behavioral Health Clinician Date  Guardian/Patient   06/24/2021 Wayne [name], [name]   06/24/2021 Wayne [name]                    Depression Rating: 6-7   Wayne [name] reports that his son's school had a black mold.  He shared the difficulties this has caused.  Wayne [name] states that more situations occurred over this past week.  He feels [name] he has come to a place of acceptance that he has to let her go.  Yet, as he spoke it still sounded [name] he was waiting for her to come around.  I gently pointed out the inconsistencies and asked him to think about what was actually happening and to think about what HE actually wanted.  I encouraged him to speak to an attorney instead of relying on his wife's interpretation of what the courts would rule, etc.  Which seems to be influencing his willingness to go along with plans that do not benefit him.   Home Practice:  complete prescribed pages in [place], PhD

## 2021-09-24 ENCOUNTER — Ambulatory Visit (INDEPENDENT_AMBULATORY_CARE_PROVIDER_SITE_OTHER): Payer: BC Managed Care – PPO | Admitting: Psychology

## 2021-09-24 DIAGNOSIS — F330 Major depressive disorder, recurrent, mild: Secondary | ICD-10-CM | POA: Diagnosis not present

## 2021-09-24 NOTE — Progress Notes (Signed)
PROGRESS NOTE:  Name: Wayne [name] Date: 09/24/2021 MRN: [id number] DOB: January 23, 1980 PCP: [name], NP  Time spent: 11:00 - 11:55 AM  Today I met with  [name] in remote video (WebEx) face-to-face individual psychotherapy.  Distance Site: Client's Home Orginating Site: Dr [name] Remote Office Consent: Obtained verbal consent to transmit session remotely    Presenting Problem:    Pt states that he recently discovered that his wife had an affair.  Two and a half weeks ago he made the discovery.  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  She states that she doesn't want to give up the affair relationship but she also wants to keep the same living arrangements.  Pt states that his wife shared that his mental health issues contributed to their marital problems.  He was able to accept his responsibility for the problems in the relationship.  Wally states that they both have had a history of indiscretions.  For as long as he can remember he has had depression.  He states that he at different points in his life he sought out treatment but he never stuck with it.      Diagnoses:  Depression, major, recurrent, mild ([place])   Individualized Treatment Plan       Strengths: loves his family, hard worker  Supports: work friends   Goal/Needs for Treatment:  In order of importance to patient 1) Manage crisis and stabilize living situation 2) Facilitate decision making and provide guidance while making a determination about his marriage 3) Learn about the impact of depression and coping skills to manage depression   Client Statement of Needs: States he is in crisis after discovering his wife's affair.   Treatment Level: Outpatient Individual Psychotherapy  Symptoms:    GAD-7 = 20 (Severe Anxiety) feeling nervous, worrying too much and not able to control worry, trouble relaxing, restless, and fearful of terrible things  happening nearly every day.  Becoming easily annoyed over half the time.  PHQ-9 = 15 (Moderately Severe Depression) Feeling down and having trouble sleeping nearly everyday.  Little interest or pleasure in doing things, poor appetite, poor self esteem and trouble concentrating more than half the time.  Moving and speaking more slowly for several days in the past two weeks.    Client Treatment Preferences: needs a therapist with experience treating someone who has    Healthcare consumer's goal for treatment:  Psychologist, [name], [name] will support the patient's ability to achieve the goals identified. Cognitive Behavioral Therapy, Dialectical Behavioral Therapy, Motivational Interviewing, parent training, and other evidenced-based practices will be used to promote progress towards healthy functioning.   Healthcare consumer Johnross Dawkins will: Actively participate in therapy, working towards healthy functioning.    *Justification for Continuation/Discontinuation of Goal: R=Revised, O=Ongoing, A=Achieved, D=Discontinued  Goal 1) Manage crisis and stabilize living situation 5 Point Likert rating baseline date: Target Date Goal Was reviewed Status Code Progress towards goal/Likert rating  06/25/2022           O              Goal 2) Facilitate decision making and provide guidance while making a determination about his marriage 5 Point Likert rating baseline date: Target Date Goal Was reviewed Status Code Progress towards goal/Likert rating  06/25/2022           O  Goal 3) Learn about the impact of depression and coping skills to manage depression 5 Point Likert rating baseline date: Target Date Goal Was reviewed Status Code Progress towards goal/Likert rating  06/25/2022           O              This plan has been reviewed and created by the following participants:  This plan will be reviewed at least every 12 months. Date Behavioral Health Clinician Date  Guardian/Patient   06/24/2021 [name], [name]   06/24/2021 [name]                    Depression Rating: 5-6   Carey reports that the family had planned a trip to [place] before things got complicated.  He states that they are still planning to go on vacation together.  I asked him to realistically consider what it will be like to spend time together as a family like everything is normal.  I noted that this trip is symbolic of how messy their boundaries are and that there is a certain amount of denial that is letting this carry on.  We were then able to talk about what is keeping him from making a clean break.  We d/e/p his fears of being on his own for the first time in his life.  We addressed the anxious thoughts and highlighted the ways in which he has grown since he first met his wife.   Home Practice:  complete prescribed pages in [place], PhD

## 2021-09-28 ENCOUNTER — Ambulatory Visit: Payer: BC Managed Care – PPO | Admitting: Psychology

## 2021-10-01 ENCOUNTER — Ambulatory Visit: Payer: BC Managed Care – PPO | Admitting: Psychology

## 2021-10-01 NOTE — Progress Notes (Unsigned)
PROGRESS NOTE:  Name: Wayne [name] Date: 10/01/2021 MRN: [id number] DOB: [birth year] PCP: [name], NP  Time spent: 11:00 - 11:55 AM  Today I met with  Wayne [name] in remote video (WebEx) face-to-face individual psychotherapy.  Distance Site: Client's Home Orginating Site: Dr [name] Remote Office Consent: Obtained verbal consent to transmit session remotely    Presenting Problem:    Pt states that he recently discovered that his wife had an affair.  Two and a half weeks ago he made the discovery.  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  She states that she doesn't want to give up the affair relationship but she also wants to keep the same living arrangements.  Pt states that his wife shared that his mental health issues contributed to their marital problems.  He was able to accept his responsibility for the problems in the relationship.  Wayne [name] states that they both have had a history of indiscretions.  For as long as he can remember he has had depression.  He states that he at different points in his life he sought out treatment but he never stuck with it.      Diagnoses:  No diagnosis found.   Individualized Treatment Plan       Strengths: loves his family, hard worker  Supports: work friends   Goal/Needs for Treatment:  In order of importance to patient 1) Manage crisis and stabilize living situation 2) Facilitate decision making and provide guidance while making a determination about his marriage 3) Learn about the impact of depression and coping skills to manage depression   Client Statement of Needs: States he is in crisis after discovering his wife's affair.   Treatment Level: Outpatient Individual Psychotherapy  Symptoms:    GAD-7 = 20 (Severe Anxiety) feeling nervous, worrying too much and not able to control worry, trouble relaxing, restless, and fearful of terrible things happening nearly every  day.  Becoming easily annoyed over half the time.  PHQ-9 = 15 (Moderately Severe Depression) Feeling down and having trouble sleeping nearly everyday.  Little interest or pleasure in doing things, poor appetite, poor self esteem and trouble concentrating more than half the time.  Moving and speaking more slowly for several days in the past two weeks.    Client Treatment Preferences: needs a therapist with experience treating someone who has    Healthcare consumer's goal for treatment:  Psychologist, Wayne [name], [name] will support the patient's ability to achieve the goals identified. Cognitive Behavioral Therapy, Dialectical Behavioral Therapy, Motivational Interviewing, parent training, and other evidenced-based practices will be used to promote progress towards healthy functioning.   Healthcare consumer Wayne [name] will: Actively participate in therapy, working towards healthy functioning.    *Justification for Continuation/Discontinuation of Goal: R=Revised, O=Ongoing, A=Achieved, D=Discontinued  Goal 1) Manage crisis and stabilize living situation 5 Point Likert rating baseline date: Target Date Goal Was reviewed Status Code Progress towards goal/Likert rating  06/25/2022           O              Goal 2) Facilitate decision making and provide guidance while making a determination about his marriage 5 Point Likert rating baseline date: Target Date Goal Was reviewed Status Code Progress towards goal/Likert rating  06/25/2022           O              Goal  3) Learn about the impact of depression and coping skills to manage depression 5 Point Likert rating baseline date: Target Date Goal Was reviewed Status Code Progress towards goal/Likert rating  06/25/2022           O              This plan has been reviewed and created by the following participants:  This plan will be reviewed at least every 12 months. Date Behavioral Health Clinician Date Guardian/Patient   06/24/2021 Wayne [name], [name]   06/24/2021 Wayne [name]                    Depression Rating: 5-6   Wayne [name] reports that the family had planned a trip to [place] before things got complicated.  He states that they are still planning to go on vacation together.  I asked him to realistically consider what it will be like to spend time together as a family like everything is normal.  I noted that this trip is symbolic of how messy their boundaries are and that there is a certain amount of denial that is letting this carry on.  We were then able to talk about what is keeping him from making a clean break.  We d/e/p his fears of being on his own for the first time in his life.  We addressed the anxious thoughts and highlighted the ways in which he has grown since he first met his wife.   Home Practice:  complete prescribed pages in [place], PhD

## 2021-10-05 ENCOUNTER — Ambulatory Visit: Payer: BC Managed Care – PPO | Admitting: Psychology

## 2021-10-12 ENCOUNTER — Ambulatory Visit (INDEPENDENT_AMBULATORY_CARE_PROVIDER_SITE_OTHER): Payer: BC Managed Care – PPO | Admitting: Psychology

## 2021-10-12 DIAGNOSIS — F330 Major depressive disorder, recurrent, mild: Secondary | ICD-10-CM

## 2021-10-12 NOTE — Progress Notes (Signed)
PROGRESS NOTE:  Name: Wayne [name] Date: 10/12/2021 MRN: [id number] DOB: 01/21/1980 PCP: [name], NP  Time spent: 11:00 - 11:55 AM  Today I met with  Wayne [name] in remote video (WebEx) face-to-face individual psychotherapy.  Distance Site: Client's Home Orginating Site: Dr [name] Remote Office Consent: Obtained verbal consent to transmit session remotely    Presenting Problem:    Pt states that he recently discovered that his wife had an affair.  Two and a half weeks ago he made the discovery.  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  She states that she doesn't want to give up the affair relationship but she also wants to keep the same living arrangements.  Pt states that his wife shared that his mental health issues contributed to their marital problems.  He was able to accept his responsibility for the problems in the relationship.  [name] states that they both have had a history of indiscretions.  For as long as he can remember he has had depression.  He states that he at different points in his life he sought out treatment but he never stuck with it.      Diagnoses:  No diagnosis found.   Individualized Treatment Plan       Strengths: loves his family, hard worker  Supports: work friends   Goal/Needs for Treatment:  In order of importance to patient 1) Manage crisis and stabilize living situation 2) Facilitate decision making and provide guidance while making a determination about his marriage 3) Learn about the impact of depression and coping skills to manage depression   Client Statement of Needs: States he is in crisis after discovering his wife's affair.   Treatment Level: Outpatient Individual Psychotherapy  Symptoms:    GAD-7 = 20 (Severe Anxiety) feeling nervous, worrying too much and not able to control worry, trouble relaxing, restless, and fearful of terrible things happening nearly every  day.  Becoming easily annoyed over half the time.  PHQ-9 = 15 (Moderately Severe Depression) Feeling down and having trouble sleeping nearly everyday.  Little interest or pleasure in doing things, poor appetite, poor self esteem and trouble concentrating more than half the time.  Moving and speaking more slowly for several days in the past two weeks.    Client Treatment Preferences: needs a therapist with experience treating someone who has    Healthcare consumer's goal for treatment:  Psychologist, [name], [name] will support the patient's ability to achieve the goals identified. Cognitive Behavioral Therapy, Dialectical Behavioral Therapy, Motivational Interviewing, parent training, and other evidenced-based practices will be used to promote progress towards healthy functioning.   Healthcare consumer Lloyd Helbert will: Actively participate in therapy, working towards healthy functioning.    *Justification for Continuation/Discontinuation of Goal: R=Revised, O=Ongoing, A=Achieved, D=Discontinued  Goal 1) Manage crisis and stabilize living situation 5 Point Likert rating baseline date: Target Date Goal Was reviewed Status Code Progress towards goal/Likert rating  06/25/2022           O              Goal 2) Facilitate decision making and provide guidance while making a determination about his marriage 5 Point Likert rating baseline date: Target Date Goal Was reviewed Status Code Progress towards goal/Likert rating  06/25/2022           O              Goal  3) Learn about the impact of depression and coping skills to manage depression 5 Point Likert rating baseline date: Target Date Goal Was reviewed Status Code Progress towards goal/Likert rating  06/25/2022           O              This plan has been reviewed and created by the following participants:  This plan will be reviewed at least every 12 months. Date Behavioral Health Clinician Date Guardian/Patient   06/24/2021 [name], [name]   06/24/2021 Lorain Rackers                    Depression Rating: 5-6   Johnmark reports that there was a crisis the night before they left on vacation.  His daughter Wayne [name] had it out with her mother around her continuing to stay in relationship with the other man Allister.  This weekend and the events surrounding the arguments between Z and their mother, have made Wayne [name] see that he's done "waiting" and needs to separate/divorce his wife.  He used the session today to work through what he wanted and what he needed to say to his wife.  I offered support and guidance about the how and when to speak to his wife and children.    Home Practice:  complete prescribed pages in [place], PhD

## 2021-10-19 ENCOUNTER — Ambulatory Visit: Payer: BC Managed Care – PPO | Admitting: Psychology

## 2021-10-19 ENCOUNTER — Ambulatory Visit (INDEPENDENT_AMBULATORY_CARE_PROVIDER_SITE_OTHER): Payer: BC Managed Care – PPO | Admitting: Psychology

## 2021-10-19 DIAGNOSIS — F330 Major depressive disorder, recurrent, mild: Secondary | ICD-10-CM | POA: Diagnosis not present

## 2021-10-19 NOTE — Progress Notes (Signed)
PROGRESS NOTE:  Name: Wayne [name] Date: 10/19/2021 MRN: [id number] DOB: May 15, 1980 PCP: [name], NP  Time spent: 11:00 - 11:55 AM  Today I met with  Tana Conch Hakes in remote video (WebEx) face-to-face individual psychotherapy.  Distance Site: Client's Home Orginating Site: Dr [name] Remote Office Consent: Obtained verbal consent to transmit session remotely    Presenting Problem:    Pt states that he recently discovered that his wife had an affair.  Two and a half weeks ago he made the discovery.  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  She states that she doesn't want to give up the affair relationship but she also wants to keep the same living arrangements.  Pt states that his wife shared that his mental health issues contributed to their marital problems.  He was able to accept his responsibility for the problems in the relationship.  Luster states that they both have had a history of indiscretions.  For as long as he can remember he has had depression.  He states that he at different points in his life he sought out treatment but he never stuck with it.      Diagnoses:  No diagnosis found.   Individualized Treatment Plan                  Strengths: loves his family, hard worker  Supports: work friends   Goal/Needs for Treatment:  In order of importance to patient 1) Manage crisis and stabilize living situation 2) Facilitate decision making and provide guidance while making a determination about his marriage 3) Learn about the impact of depression and coping skills to manage depression   Client Statement of Needs: States he is in crisis after discovering his wife's affair.   Treatment Level: Outpatient Individual Psychotherapy  Symptoms:    GAD-7 = 20 (Severe Anxiety) feeling nervous, worrying too much and not able to control worry, trouble relaxing, restless, and fearful of terrible things happening  nearly every day.  Becoming easily annoyed over half the time.  PHQ-9 = 15 (Moderately Severe Depression) Feeling down and having trouble sleeping nearly everyday.  Little interest or pleasure in doing things, poor appetite, poor self esteem and trouble concentrating more than half the time.  Moving and speaking more slowly for several days in the past two weeks.    Client Treatment Preferences: needs a therapist with experience treating someone who has    Healthcare consumer's goal for treatment:  Psychologist, [name], [name] will support the patient's ability to achieve the goals identified. Cognitive Behavioral Therapy, Dialectical Behavioral Therapy, Motivational Interviewing, parent training, and other evidenced-based practices will be used to promote progress towards healthy functioning.   Healthcare consumer Taray Shupert will: Actively participate in therapy, working towards healthy functioning.    *Justification for Continuation/Discontinuation of Goal: R=Revised, O=Ongoing, A=Achieved, D=Discontinued  Goal 1) Manage crisis and stabilize living situation 5 Point Likert rating baseline date: Target Date Goal Was reviewed Status Code Progress towards goal/Likert rating  06/25/2022           O              Goal 2) Facilitate decision making and provide guidance while making a determination about his marriage 5 Point Likert rating baseline date: Target Date Goal Was reviewed Status Code Progress towards goal/Likert rating  06/25/2022           O  Goal 3) Learn about the impact of depression and coping skills to manage depression 5 Point Likert rating baseline date: Target Date Goal Was reviewed Status Code Progress towards goal/Likert rating  06/25/2022           O              This plan has been reviewed and created by the following participants:  This plan will be reviewed at least every 12 months. Date Behavioral Health Clinician Date Guardian/Patient    06/24/2021 [name], [name]   06/24/2021 Aja Sinkfield                    Depression Rating: 5-6   Esvin reports that after their trip to [place] they all got sick and have been slowly been getting better.  His wife is planning to live away for a month, but is still calling it "practice."  We talked frankly about what is happening and strategies to move forward.  We also talked about his son [name] who is ASD and how to help him deal with the emotions that are coming up, helping him with multiple transitions and providing the reassurance he needs.  Lastly, we d/p the "symbiotic" dynamics that were set up between he and his wife and reassured him that he was a bright and resourceful individual who could live independently.  He was able to see that he would come out of this a better and stronger man.   Home Practice:  complete prescribed pages in [place], PhD

## 2021-10-26 ENCOUNTER — Ambulatory Visit: Payer: BC Managed Care – PPO | Admitting: Psychology

## 2021-11-02 ENCOUNTER — Ambulatory Visit: Payer: BC Managed Care – PPO | Admitting: Psychology

## 2021-11-02 ENCOUNTER — Ambulatory Visit (INDEPENDENT_AMBULATORY_CARE_PROVIDER_SITE_OTHER): Payer: BC Managed Care – PPO | Admitting: Psychology

## 2021-11-02 DIAGNOSIS — F330 Major depressive disorder, recurrent, mild: Secondary | ICD-10-CM

## 2021-11-02 NOTE — Progress Notes (Signed)
PROGRESS NOTE:  Name: Wayne [name] Date: 11/02/2021 MRN: [id number] DOB: 16-Sep-1980 PCP: [name], NP  Time spent: 10:00 - 10:55 AM  Today I met with  Wayne [name] in remote video (WebEx) face-to-face individual psychotherapy.  Distance Site: Client's Home Orginating Site: Dr [name] Remote Office Consent: Obtained verbal consent to transmit session remotely    Presenting Problem:    Pt states that he recently discovered that his wife ____________ had an affair.  Two and a half weeks ago he made the discovery.  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  She states that she doesn't want to give up the affair relationship but she also wants to keep the same living arrangements.  Pt states that his wife shared that his mental health issues contributed to their marital problems.  He was able to accept his responsibility for the problems in the relationship.  Wayne [name] states that they both have had a history of indiscretions.  For as long as he can remember he has had depression.  He states that he at different points in his life he sought out treatment but he never stuck with it.      Diagnoses:  Depression, major, recurrent, mild ([place])   Individualized Treatment Plan                  Strengths: loves his family, hard worker  Supports: work friends   Goal/Needs for Treatment:  In order of importance to patient 1) Manage crisis and stabilize living situation 2) Facilitate decision making and provide guidance while making a determination about his marriage 3) Learn about the impact of depression and coping skills to manage depression   Client Statement of Needs: States he is in crisis after discovering his wife's affair.   Treatment Level: Outpatient Individual Psychotherapy  Symptoms:    GAD-7 = 20 (Severe Anxiety) feeling nervous, worrying too much and not able to control worry, trouble relaxing, restless, and  fearful of terrible things happening nearly every day.  Becoming easily annoyed over half the time.  PHQ-9 = 15 (Moderately Severe Depression) Feeling down and having trouble sleeping nearly everyday.  Little interest or pleasure in doing things, poor appetite, poor self esteem and trouble concentrating more than half the time.  Moving and speaking more slowly for several days in the past two weeks.    Client Treatment Preferences: needs a therapist with experience treating someone who has    Healthcare consumer's goal for treatment:  Psychologist, Wayne [name], [name] will support the patient's ability to achieve the goals identified. Cognitive Behavioral Therapy, Dialectical Behavioral Therapy, Motivational Interviewing, parent training, and other evidenced-based practices will be used to promote progress towards healthy functioning.   Healthcare consumer Wayne [name] will: Actively participate in therapy, working towards healthy functioning.    *Justification for Continuation/Discontinuation of Goal: R=Revised, O=Ongoing, A=Achieved, D=Discontinued  Goal 1) Manage crisis and stabilize living situation 5 Point Likert rating baseline date: Target Date Goal Was reviewed Status Code Progress towards goal/Likert rating  06/25/2022           O              Goal 2) Facilitate decision making and provide guidance while making a determination about his marriage 5 Point Likert rating baseline date: Target Date Goal Was reviewed Status Code Progress towards goal/Likert rating  06/25/2022           O  Goal 3) Learn about the impact of depression and coping skills to manage depression 5 Point Likert rating baseline date: Target Date Goal Was reviewed Status Code Progress towards goal/Likert rating  06/25/2022           O              This plan has been reviewed and created by the following participants:  This plan will be reviewed at least every 12 months. Date Behavioral Health  Clinician Date Guardian/Patient   06/24/2021 Wayne [name], [name]   06/24/2021 Wayne [name]                    Depression Rating: 4-5   Wayne [name] reports that he is moving forward with separating their finances and making changes to divide their household.  He is also attempting to take on more responsibilities he was dependent on his wife to do.  We talked about the need to separate as a sign that he is ready to be more independent and break away from their symbiotic relationship.  Lastly, we d/e/p his developing separate interests and hobbies that aren't associated with the affair (ie.,gaming) and give him something to do when Wayne [name] is with his mother.   We also d/p that his marital problems and separation has been a catalyst for change for the entire family and that not all of this has been a bad.   Home Practice:  Buy Men with ADHD Workbook    Wayne Crochet, PhD   ______________________________________________________________  Wife:  Son: Wayne [name] (10)  Daughter: Wayne [name]

## 2021-11-09 ENCOUNTER — Ambulatory Visit: Payer: BC Managed Care – PPO | Admitting: Psychology

## 2021-11-16 ENCOUNTER — Ambulatory Visit (INDEPENDENT_AMBULATORY_CARE_PROVIDER_SITE_OTHER): Payer: BC Managed Care – PPO | Admitting: Psychology

## 2021-11-16 ENCOUNTER — Ambulatory Visit: Payer: BC Managed Care – PPO | Admitting: Psychology

## 2021-11-16 DIAGNOSIS — F330 Major depressive disorder, recurrent, mild: Secondary | ICD-10-CM | POA: Diagnosis not present

## 2021-11-16 NOTE — Progress Notes (Signed)
PROGRESS NOTE:  Name: Wayne [name] Date: 11/16/2021 MRN: [id number] DOB: [birth year] PCP: Wayne Pitcher, NP  Time spent: 10:00 - 10:55 AM  Today I met with  Wayne [name] in remote video (WebEx) face-to-face individual psychotherapy.  Distance Site: Client's Home Orginating Site: Dr [name] Remote Office Consent: Obtained verbal consent to transmit session remotely    Presenting Problem:    Pt states that he recently discovered that his wife ____________ had an affair.  Two and a half weeks ago he made the discovery.  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  She states that she doesn't want to give up the affair relationship but she also wants to keep the same living arrangements.  Pt states that his wife shared that his mental health issues contributed to their marital problems.  He was able to accept his responsibility for the problems in the relationship.  Wayne [name] states that they both have had a history of indiscretions.  For as long as he can remember he has had depression.  He states that he at different points in his life he sought out treatment but he never stuck with it.      Diagnoses:  No diagnosis found.   Individualized Treatment Plan                  Strengths: loves his family, hard worker  Supports: work friends   Goal/Needs for Treatment:  In order of importance to patient 1) Manage crisis and stabilize living situation 2) Facilitate decision making and provide guidance while making a determination about his marriage 3) Learn about the impact of depression and coping skills to manage depression   Client Statement of Needs: States he is in crisis after discovering his wife's affair.   Treatment Level: Outpatient Individual Psychotherapy  Symptoms:    GAD-7 = 20 (Severe Anxiety) feeling nervous, worrying too much and not able to control worry, trouble relaxing, restless, and fearful of terrible things  happening nearly every day.  Becoming easily annoyed over half the time.  PHQ-9 = 15 (Moderately Severe Depression) Feeling down and having trouble sleeping nearly everyday.  Little interest or pleasure in doing things, poor appetite, poor self esteem and trouble concentrating more than half the time.  Moving and speaking more slowly for several days in the past two weeks.    Client Treatment Preferences: needs a therapist with experience treating someone who has    Healthcare consumer's goal for treatment:  Psychologist, Wayne [name], [name] will support the patient's ability to achieve the goals identified. Cognitive Behavioral Therapy, Dialectical Behavioral Therapy, Motivational Interviewing, parent training, and other evidenced-based practices will be used to promote progress towards healthy functioning.   Healthcare consumer Wayne [name] will: Actively participate in therapy, working towards healthy functioning.    *Justification for Continuation/Discontinuation of Goal: R=Revised, O=Ongoing, A=Achieved, D=Discontinued  Goal 1) Manage crisis and stabilize living situation 5 Point Likert rating baseline date: Target Date Goal Was reviewed Status Code Progress towards goal/Likert rating  06/25/2022           O              Goal 2) Facilitate decision making and provide guidance while making a determination about his marriage 5 Point Likert rating baseline date: Target Date Goal Was reviewed Status Code Progress towards goal/Likert rating  06/25/2022           O  Goal 3) Learn about the impact of depression and coping skills to manage depression 5 Point Likert rating baseline date: Target Date Goal Was reviewed Status Code Progress towards goal/Likert rating  06/25/2022           O              This plan has been reviewed and created by the following participants:  This plan will be reviewed at least every 12 months. Date Behavioral Health Clinician Date  Guardian/Patient   06/24/2021 Wayne [name], [name]   06/24/2021 Wayne [name]                    Depression Rating: 4-5   Wayne [name] reports that he took his son out for trick-or-treating but later c/o of abdominal pain.  He was taken to the ER and they concluded that it was due to constipation.  His son Wayne [name] is autistic and we d/e ways to make adjustments to his diet given his fixed eating habits.  [name] states that he remains stuck.  His wife continues say one thing and do another but he can't seem to move forward.  We d/e/p his codependent relationship with his wife.  We d/e/p his fears of functioning independently.  I instructed him to write down all of his fears and anxieties about living on his own between now and our next session.  In one column he is to write his fears and in another to write how he might work through each concern. In our next session, we will d/p each response.  I encouraged [name] to first decide what he wants/needs for himself in the marriage, not to wait in her to decide for them, and then begin to plan for how to make that happen.     Home Practice:  Buy Men with ADHD Workbook    Wayne Crochet, PhD   ______________________________________________________________  Wife:  Son: Wayne [name] (10)  Daughter: Wayne [name]

## 2021-11-23 ENCOUNTER — Ambulatory Visit: Payer: BC Managed Care – PPO | Admitting: Psychology

## 2021-11-30 ENCOUNTER — Ambulatory Visit: Payer: BC Managed Care – PPO | Admitting: Psychology

## 2021-12-07 ENCOUNTER — Ambulatory Visit: Payer: BC Managed Care – PPO | Admitting: Psychology

## 2021-12-14 ENCOUNTER — Ambulatory Visit: Payer: BC Managed Care – PPO | Admitting: Psychology

## 2021-12-14 ENCOUNTER — Ambulatory Visit (INDEPENDENT_AMBULATORY_CARE_PROVIDER_SITE_OTHER): Payer: BC Managed Care – PPO | Admitting: Psychology

## 2021-12-14 DIAGNOSIS — F330 Major depressive disorder, recurrent, mild: Secondary | ICD-10-CM | POA: Diagnosis not present

## 2021-12-14 NOTE — Progress Notes (Signed)
PROGRESS NOTE:  Name: Wayne [name] Date: 12/14/2021 MRN: [id number] DOB: December 10, 1980 PCP: [name], NP  Time spent: 10:00 - 10:55 AM  Today I met with  Wayne [name] in remote video (WebEx) face-to-face individual psychotherapy.  Distance Site: Client's Home Orginating Site: Dr [name] Remote Office Consent: Obtained verbal consent to transmit session remotely    Presenting Problem:    Pt states that he recently discovered that his wife ____________ had an affair.  Two and a half weeks ago he made the discovery.  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  She states that she doesn't want to give up the affair relationship but she also wants to keep the same living arrangements.  Pt states that his wife shared that his mental health issues contributed to their marital problems.  He was able to accept his responsibility for the problems in the relationship.  Wayne [name] states that they both have had a history of indiscretions.  For as long as he can remember he has had depression.  He states that he at different points in his life he sought out treatment but he never stuck with it.      Diagnoses:  No diagnosis found.   Individualized Treatment Plan                  Strengths: loves his family, hard worker  Supports: work friends   Goal/Needs for Treatment:  In order of importance to patient 1) Manage crisis and stabilize living situation 2) Facilitate decision making and provide guidance while making a determination about his marriage 3) Learn about the impact of depression and coping skills to manage depression   Client Statement of Needs: States he is in crisis after discovering his wife's affair.   Treatment Level: Outpatient Individual Psychotherapy  Symptoms:    GAD-7 = 20 (Severe Anxiety) feeling nervous, worrying too much and not able to control worry, trouble relaxing, restless, and fearful of terrible things  happening nearly every day.  Becoming easily annoyed over half the time.  PHQ-9 = 15 (Moderately Severe Depression) Feeling down and having trouble sleeping nearly everyday.  Little interest or pleasure in doing things, poor appetite, poor self esteem and trouble concentrating more than half the time.  Moving and speaking more slowly for several days in the past two weeks.    Client Treatment Preferences: needs a therapist with experience treating someone who has    Wayne consumer's goal for treatment:  Psychologist, Wayne [name], [name] will support the patient's ability to achieve the goals identified. Cognitive Behavioral Therapy, Dialectical Behavioral Therapy, Motivational Interviewing, parent training, and other evidenced-based practices will be used to promote progress towards healthy functioning.   Wayne [name] will: Actively participate in therapy, working towards healthy functioning.    *Justification for Continuation/Discontinuation of Goal: R=Revised, O=Ongoing, A=Achieved, D=Discontinued  Goal 1) Manage crisis and stabilize living situation 5 Point Likert rating baseline date: Target Date Goal Was reviewed Status Code Progress towards goal/Likert rating  06/25/2022           O              Goal 2) Facilitate decision making and provide guidance while making a determination about his marriage 5 Point Likert rating baseline date: Target Date Goal Was reviewed Status Code Progress towards goal/Likert rating  06/25/2022           O  Goal 3) Learn about the impact of depression and coping skills to manage depression 5 Point Likert rating baseline date: Target Date Goal Was reviewed Status Code Progress towards goal/Likert rating  06/25/2022           O              This plan has been reviewed and created by the following participants:  This plan will be reviewed at least every 12 months. Date Behavioral Health Clinician Date  Guardian/Patient   06/24/2021 Wayne [name], [name]   06/24/2021 Wayne [name]                    Depression Rating: 4-5   Wayne [name] reports that Wednesday night last week something occurred and it led him to finally decide to leave his wife.  We d/e/p the obstacles he sees blocking his progress to move forward and how to remove the ones he is responsible for.     Home Practice:  Buy Men with ADHD Workbook    Wayne Crochet, PhD   ______________________________________________________________  Wife:  Son: Wayne [name] (10)  Daughter: Wayne [name]

## 2021-12-21 ENCOUNTER — Ambulatory Visit: Payer: BC Managed Care – PPO | Admitting: Psychology

## 2021-12-28 ENCOUNTER — Ambulatory Visit: Payer: BC Managed Care – PPO | Admitting: Psychology

## 2022-01-18 ENCOUNTER — Ambulatory Visit: Payer: BC Managed Care – PPO | Admitting: Psychology

## 2022-01-25 ENCOUNTER — Ambulatory Visit (INDEPENDENT_AMBULATORY_CARE_PROVIDER_SITE_OTHER): Payer: BC Managed Care – PPO | Admitting: Psychology

## 2022-01-25 DIAGNOSIS — F330 Major depressive disorder, recurrent, mild: Secondary | ICD-10-CM | POA: Diagnosis not present

## 2022-01-25 NOTE — Progress Notes (Signed)
PROGRESS NOTE:  Name: Wayne [name] Date: 01/25/2022 MRN: [id number] DOB: Jul 16, 1980 PCP: [name], NP  Time spent: 10:00 - 10:55 AM  Today I met with  Wayne [name] in remote video (WebEx) face-to-face individual psychotherapy.  Distance Site: Client's Home Orginating Site: Dr [name] Remote Office Consent: Obtained verbal consent to transmit session remotely    Presenting Problem:    Pt states that he recently discovered that his wife ____________ had an affair.  Two and a half weeks ago he made the discovery.  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  She states that she doesn't want to give up the affair relationship but she also wants to keep the same living arrangements.  Pt states that his wife shared that his mental health issues contributed to their marital problems.  He was able to accept his responsibility for the problems in the relationship.  Wayne [name] states that they both have had a history of indiscretions.  For as long as he can remember he has had depression.  He states that he at different points in his life he sought out treatment but he never stuck with it.      Diagnoses:  No diagnosis found.   Individualized Treatment Plan                  Strengths: loves his family, hard worker  Supports: work friends   Goal/Needs for Treatment:  In order of importance to patient 1) Manage crisis and stabilize living situation 2) Facilitate decision making and provide guidance while making a determination about his marriage 3) Learn about the impact of depression and coping skills to manage depression   Client Statement of Needs: States he is in crisis after discovering his wife's affair.   Treatment Level: Outpatient Individual Psychotherapy  Symptoms:    GAD-7 = 20 (Severe Anxiety) feeling nervous, worrying too much and not able to control worry, trouble relaxing, restless, and fearful of terrible  things happening nearly every day.  Becoming easily annoyed over half the time.  PHQ-9 = 15 (Moderately Severe Depression) Feeling down and having trouble sleeping nearly everyday.  Little interest or pleasure in doing things, poor appetite, poor self esteem and trouble concentrating more than half the time.  Moving and speaking more slowly for several days in the past two weeks.    Client Treatment Preferences: needs a therapist with experience treating someone who has    Healthcare consumer's goal for treatment:  Psychologist, Wayne [name], [name] will support the patient's ability to achieve the goals identified. Cognitive Behavioral Therapy, Dialectical Behavioral Therapy, Motivational Interviewing, parent training, and other evidenced-based practices will be used to promote progress towards healthy functioning.   Healthcare consumer Wayne [name] will: Actively participate in therapy, working towards healthy functioning.    *Justification for Continuation/Discontinuation of Goal: R=Revised, O=Ongoing, A=Achieved, D=Discontinued  Goal 1) Manage crisis and stabilize living situation 5 Point Likert rating baseline date: Target Date Goal Was reviewed Status Code Progress towards goal/Likert rating  06/25/2022           O              Goal 2) Facilitate decision making and provide guidance while making a determination about his marriage 5 Point Likert rating baseline date: Target Date Goal Was reviewed Status Code Progress towards goal/Likert rating  06/25/2022           O  Goal 3) Learn about the impact of depression and coping skills to manage depression 5 Point Likert rating baseline date: Target Date Goal Was reviewed Status Code Progress towards goal/Likert rating  06/25/2022           O              This plan has been reviewed and created by the following participants:  This plan will be reviewed at least every 12 months. Date Behavioral Health Clinician Date  Guardian/Patient   06/24/2021 Wayne [name], [name]   06/24/2021 Wayne [name]                    Depression Rating: 4-6   It has been six weeks since we last met due to the holidays and vacations.  As a result, a good deal of today's session focused on getting caught up on Wayne [name]'s current life.  Wayne [name] reports that his child Wayne [name] has moved back in a week ago.  Their partner relapsed and they chose to move out.  Wayne [name] and Wayne [name] don't always get along and it can be difficult at times.  He states that he often gets caught in the middle and he is struggling to find a way to help them deescalate situations.  Wayne [name] states that there has been a change of mind regarding Wayne [name] and they have decided to work things out.  He shared the factors that led to their decision and what they are doing to move forward.  Lastly, Wayne [name] talked about a difficult situation he has been contending with at work.  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  We d/ ways for him to better deal with his work stress.   Home Practice:  Buy Men with ADHD Workbook    Wayne Crochet, PhD   ______________________________________________________________  Wife: Wayne [name] -  Son: Wayne [name] - 10 Child: Wayne [name] - 64 ([name]- dead name)

## 2022-02-08 ENCOUNTER — Ambulatory Visit (INDEPENDENT_AMBULATORY_CARE_PROVIDER_SITE_OTHER): Payer: BC Managed Care – PPO | Admitting: Psychology

## 2022-02-08 DIAGNOSIS — F330 Major depressive disorder, recurrent, mild: Secondary | ICD-10-CM | POA: Diagnosis not present

## 2022-02-08 NOTE — Progress Notes (Signed)
PROGRESS NOTE:  Name: Wayne [name] Date: 02/08/2022 MRN: [id number] DOB: 1980/07/12 PCP: [name], NP  Time spent: 10:00 - 10:55 AM  Today I met with  Wayne [name] in remote video (WebEx) face-to-face individual psychotherapy.  Distance Site: Client's Home Orginating Site: Dr [name] Remote Office Consent: Obtained verbal consent to transmit session remotely    Presenting Problem:    Pt states that he recently discovered that his wife Wayne [name] had an affair.  Two and a half weeks ago he made the discovery.  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  She states that she doesn't want to give up the affair relationship but she also wants to keep the same living arrangements.  Pt states that his wife shared that his mental health issues contributed to their marital problems.  He was able to accept his responsibility for the problems in the relationship.  Wayne [name] states that they both have had a history of indiscretions.  For as long as he can remember he has had depression.  He states that he at different points in his life he sought out treatment but he never stuck with it.      Diagnoses:  Depression, major, recurrent, mild ([place])   Individualized Treatment Plan                  Strengths: loves his family, hard worker  Supports: work friends   Goal/Needs for Treatment:  In order of importance to patient 1) Manage crisis and stabilize living situation 2) Facilitate decision making and provide guidance while making a determination about his marriage 3) Learn about the impact of depression and coping skills to manage depression   Client Statement of Needs: States he is in crisis after discovering his wife's affair.   Treatment Level: Outpatient Individual Psychotherapy  Symptoms:    GAD-7 = 20 (Severe Anxiety) feeling nervous, worrying too much and not able to control worry, trouble relaxing, restless, and fearful of  terrible things happening nearly every day.  Becoming easily annoyed over half the time.  PHQ-9 = 15 (Moderately Severe Depression) Feeling down and having trouble sleeping nearly everyday.  Little interest or pleasure in doing things, poor appetite, poor self esteem and trouble concentrating more than half the time.  Moving and speaking more slowly for several days in the past two weeks.    Client Treatment Preferences: needs a therapist with experience treating someone who has    Healthcare consumer's goal for treatment:  Psychologist, Wayne [name], [name] will support the patient's ability to achieve the goals identified. Cognitive Behavioral Therapy, Dialectical Behavioral Therapy, Motivational Interviewing, parent training, and other evidenced-based practices will be used to promote progress towards healthy functioning.   Healthcare consumer Wayne [name] will: Actively participate in therapy, working towards healthy functioning.    *Justification for Continuation/Discontinuation of Goal: R=Revised, O=Ongoing, A=Achieved, D=Discontinued  Goal 1) Manage crisis and stabilize living situation 5 Point Likert rating baseline date: Target Date Goal Was reviewed Status Code Progress towards goal/Likert rating  06/25/2022           O              Goal 2) Facilitate decision making and provide guidance while making a determination about his marriage 5 Point Likert rating baseline date: Target Date Goal Was reviewed Status Code Progress towards goal/Likert rating  06/25/2022  O              Goal 3) Learn about the impact of depression and coping skills to manage depression 5 Point Likert rating baseline date: Target Date Goal Was reviewed Status Code Progress towards goal/Likert rating  06/25/2022           O              This plan has been reviewed and created by the following participants:  This plan will be reviewed at least every 12 months. Date Behavioral Health Clinician  Date Guardian/Patient   06/24/2021 Wayne [name], [name]   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  We d/e/p what occurred most recently and the implications for his team.  Wayne [name] stated that he continues to work on his marriage and family issues.  He shared one incident that had the potential to derail their progress, but together they handled the problem well.  Ongoing issues between his wife and their son Wayne [name] continue to be difficult to negotiate.  We d/e possible ways for him to be supportive of both [name] and Wayne [name], while helping to diffuse the conflict.  Home Practice:  Buy Men with ADHD Workbook    Wayne Crochet, PhD   ______________________________________________________________  Wife: Wayne [name] -  Son: Wayne [name] - 10 Child: Wayne [name] - 45 (Wayne [name]- dead name)

## 2022-02-22 ENCOUNTER — Ambulatory Visit (INDEPENDENT_AMBULATORY_CARE_PROVIDER_SITE_OTHER): Payer: BC Managed Care – PPO | Admitting: Psychology

## 2022-02-22 DIAGNOSIS — F330 Major depressive disorder, recurrent, mild: Secondary | ICD-10-CM | POA: Diagnosis not present

## 2022-02-22 NOTE — Progress Notes (Signed)
PROGRESS NOTE:  Name: Wayne [name] Date: 02/22/2022 MRN: [id number] DOB: 11/24/1980 PCP: [name], NP  Time spent: 10:00 - 10:55 AM  Today I met with  Wayne [name] in remote video (WebEx) face-to-face individual psychotherapy.  Distance Site: Client's Home Orginating Site: Dr [name] Remote Office Consent: Obtained verbal consent to transmit session remotely    Presenting Problem:    Pt states that he recently discovered that his wife [name] had an affair.  Two and a half weeks ago he made the discovery.  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  She states that she doesn't want to give up the affair relationship but she also wants to keep the same living arrangements.  Pt states that his wife shared that his mental health issues contributed to their marital problems.  He was able to accept his responsibility for the problems in the relationship.  Wayne [name] states that they both have had a history of indiscretions.  For as long as he can remember he has had depression.  He states that he at different points in his life he sought out treatment but he never stuck with it.      Diagnoses:  Depression, major, recurrent, mild ([place])   Individualized Treatment Plan                  Strengths: loves his family, hard worker  Supports: work friends   Goal/Needs for Treatment:  In order of importance to patient 1) Manage crisis and stabilize living situation 2) Facilitate decision making and provide guidance while making a determination about his marriage 3) Learn about the impact of depression and coping skills to manage depression   Client Statement of Needs: States he is in crisis after discovering his wife's affair.   Treatment Level: Outpatient Individual Psychotherapy  Symptoms:    GAD-7 = 20 (Severe Anxiety) feeling nervous, worrying too much and not able to control worry, trouble relaxing, restless, and fearful of  terrible things happening nearly every day.  Becoming easily annoyed over half the time.  PHQ-9 = 15 (Moderately Severe Depression) Feeling down and having trouble sleeping nearly everyday.  Little interest or pleasure in doing things, poor appetite, poor self esteem and trouble concentrating more than half the time.  Moving and speaking more slowly for several days in the past two weeks.    Client Treatment Preferences: needs a therapist with experience treating someone who has    Healthcare consumer's goal for treatment:  Psychologist, [name], [name] will support the patient's ability to achieve the goals identified. Cognitive Behavioral Therapy, Dialectical Behavioral Therapy, Motivational Interviewing, parent training, and other evidenced-based practices will be used to promote progress towards healthy functioning.   Healthcare consumer Wayne [name] will: Actively participate in therapy, working towards healthy functioning.    *Justification for Continuation/Discontinuation of Goal: R=Revised, O=Ongoing, A=Achieved, D=Discontinued  Goal 1) Manage crisis and stabilize living situation 5 Point Likert rating baseline date: Target Date Goal Was reviewed Status Code Progress towards goal/Likert rating  06/25/2022           O              Goal 2) Facilitate decision making and provide guidance while making a determination about his marriage 5 Point Likert rating baseline date: Target Date Goal Was reviewed Status Code Progress towards goal/Likert rating  06/25/2022  O              Goal 3) Learn about the impact of depression and coping skills to manage depression 5 Point Likert rating baseline date: Target Date Goal Was reviewed Status Code Progress towards goal/Likert rating  06/25/2022           O              This plan has been reviewed and created by the following participants:  This plan will be reviewed at least every 12 months. Date Behavioral Health Clinician  Date Guardian/Patient   06/24/2021 [name], [name]   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    Wayne [name] describes that [name] is still responding in a very dysregulated manner over everything.  We d/e/p all the contributing issues, how to provide support and encourage her to get the help she needs.  More importantly, we d/ Wayne [name] own pattern of w/d when things get difficult or are uncertain, and e/ the cost and pay out of being present and attuned to his wife and family.  Wayne [name] agreed to continue to reflect on this last topic and to continue th e d/ next session.   Home Practice:  Buy Men with ADHD Workbook    [name], PhD   ______________________________________________________________  Wife: [name] -  Son: [name] - 10 Child: Z - 6 ([name]- dead name)

## 2022-03-08 ENCOUNTER — Ambulatory Visit (INDEPENDENT_AMBULATORY_CARE_PROVIDER_SITE_OTHER): Payer: BC Managed Care – PPO | Admitting: Psychology

## 2022-03-08 DIAGNOSIS — F330 Major depressive disorder, recurrent, mild: Secondary | ICD-10-CM | POA: Diagnosis not present

## 2022-03-08 NOTE — Progress Notes (Signed)
PROGRESS NOTE:  Name: Wayne [name] Date: 03/08/2022 MRN: [id number] DOB: 11-12-1980 PCP: [name], NP  Time spent: 10:00 - 10:55 AM  Today I met with  Tana Conch Blye in remote video (WebEx) face-to-face individual psychotherapy.  Distance Site: Client's Home Orginating Site: Dr [name] Remote Office Consent: Obtained verbal consent to transmit session remotely    Presenting Problem:    Pt states that he recently discovered that his wife Wayne [name] had an affair.  Two and a half weeks ago he made the discovery.  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  She states that she doesn't want to give up the affair relationship but she also wants to keep the same living arrangements.  Pt states that his wife shared that his mental health issues contributed to their marital problems.  He was able to accept his responsibility for the problems in the relationship.  Atlas states that they both have had a history of indiscretions.  For as long as he can remember he has had depression.  He states that he at different points in his life he sought out treatment but he never stuck with it.      Diagnoses:  Depression, major, recurrent, mild ([place])   Individualized Treatment Plan                  Strengths: loves his family, hard worker  Supports: work friends   Goal/Needs for Treatment:  In order of importance to patient 1) Manage crisis and stabilize living situation 2) Facilitate decision making and provide guidance while making a determination about his marriage 3) Learn about the impact of depression and coping skills to manage depression   Client Statement of Needs: States he is in crisis after discovering his wife's affair.   Treatment Level: Outpatient Individual Psychotherapy  Symptoms:    GAD-7 = 20 (Severe Anxiety) feeling nervous, worrying too much and not able to control worry, trouble relaxing, restless, and fearful of  terrible things happening nearly every day.  Becoming easily annoyed over half the time.  PHQ-9 = 15 (Moderately Severe Depression) Feeling down and having trouble sleeping nearly everyday.  Little interest or pleasure in doing things, poor appetite, poor self esteem and trouble concentrating more than half the time.  Moving and speaking more slowly for several days in the past two weeks.    Client Treatment Preferences: needs a therapist with experience treating someone who has    Healthcare consumer's goal for treatment:  Psychologist, [name], [name] will support the patient's ability to achieve the goals identified. Cognitive Behavioral Therapy, Dialectical Behavioral Therapy, Motivational Interviewing, parent training, and other evidenced-based practices will be used to promote progress towards healthy functioning.   Healthcare consumer Calixto Menchaca will: Actively participate in therapy, working towards healthy functioning.    *Justification for Continuation/Discontinuation of Goal: R=Revised, O=Ongoing, A=Achieved, D=Discontinued  Goal 1) Manage crisis and stabilize living situation 5 Point Likert rating baseline date: Target Date Goal Was reviewed Status Code Progress towards goal/Likert rating  06/25/2022           O              Goal 2) Facilitate decision making and provide guidance while making a determination about his marriage 5 Point Likert rating baseline date: Target Date Goal Was reviewed Status Code Progress towards goal/Likert rating  06/25/2022  O              Goal 3) Learn about the impact of depression and coping skills to manage depression 5 Point Likert rating baseline date: Target Date Goal Was reviewed Status Code Progress towards goal/Likert rating  06/25/2022           O              This plan has been reviewed and created by the following participants:  This plan will be reviewed at least every 12 months. Date Behavioral Health Clinician  Date Guardian/Patient   06/24/2021 [name], [name]   06/24/2021 Conrad Quang                    Depression Rating: 3-6   Harvy reports that he has a root canal scheduled tomorrow and hasn't been feeling well the past few days.    Mujtaba states that a work situation we d/ has finally resolved.  I note that many times this trying situations are an opportunity for self reflection and growth.  We d/e/p what occurred, what he's learned from this difficult situation and where he might make some adjustments in how he approaches conflict in the future.  Finally, we touched base on where things are at in his marriage.  He shared that Wayne [name] is going though some of her own personal challenges.   We d/e/p what was occurring, how he is trying to provide support and hopes that this is an opening to getting help for herself.   Home Practice:  Buy Men with ADHD Workbook    [name], PhD   ______________________________________________________________  Wife: Wayne [name] -  Son: Wayne [name] - 10 Child: Wayne [name] - 57 ([name]- dead name)

## 2022-03-22 ENCOUNTER — Ambulatory Visit (INDEPENDENT_AMBULATORY_CARE_PROVIDER_SITE_OTHER): Payer: BC Managed Care – PPO | Admitting: Psychology

## 2022-03-22 DIAGNOSIS — F411 Generalized anxiety disorder: Secondary | ICD-10-CM | POA: Diagnosis not present

## 2022-03-22 NOTE — Progress Notes (Signed)
PROGRESS NOTE:  Name: Wayne [name] Date: 03/22/2022 MRN: [id number] DOB: [birth year] PCP: Wayne Pitcher, NP  Time spent: 10:00 - 10:55 AM  Today I met with  Wayne [name] in remote video (WebEx) face-to-face individual psychotherapy.  Distance Site: Client's Home Orginating Site: Dr [name] Remote Office Consent: Obtained verbal consent to transmit session remotely    Presenting Problem:    Pt states that he recently discovered that his wife Wayne [name] had an affair.  Two and a half weeks ago he made the discovery.  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  She states that she doesn't want to give up the affair relationship but she also wants to keep the same living arrangements.  Pt states that his wife shared that his mental health issues contributed to their marital problems.  He was able to accept his responsibility for the problems in the relationship.  Wayne [name] states that they both have had a history of indiscretions.  For as long as he can remember he has had depression.  He states that he at different points in his life he sought out treatment but he never stuck with it.      Diagnoses:  GAD (generalized anxiety disorder)   Individualized Treatment Plan                  Strengths: loves his family, hard worker  Supports: work friends   Goal/Needs for Treatment:  In order of importance to patient 1) Manage crisis and stabilize living situation 2) Facilitate decision making and provide guidance while making a determination about his marriage 3) Learn about the impact of depression and coping skills to manage depression   Client Statement of Needs: States he is in crisis after discovering his wife's affair.   Treatment Level: Outpatient Individual Psychotherapy  Symptoms:    GAD-7 = 20 (Severe Anxiety) feeling nervous, worrying too much and not able to control worry, trouble relaxing, restless, and fearful of terrible  things happening nearly every day.  Becoming easily annoyed over half the time.  PHQ-9 = 15 (Moderately Severe Depression) Feeling down and having trouble sleeping nearly everyday.  Little interest or pleasure in doing things, poor appetite, poor self esteem and trouble concentrating more than half the time.  Moving and speaking more slowly for several days in the past two weeks.    Client Treatment Preferences: needs a therapist with experience treating someone who has    Healthcare consumer's goal for treatment:  Psychologist, Wayne [name], [name] will support the patient's ability to achieve the goals identified. Cognitive Behavioral Therapy, Dialectical Behavioral Therapy, Motivational Interviewing, parent training, and other evidenced-based practices will be used to promote progress towards healthy functioning.   Healthcare consumer Wayne [name] will: Actively participate in therapy, working towards healthy functioning.    *Justification for Continuation/Discontinuation of Goal: R=Revised, O=Ongoing, A=Achieved, D=Discontinued  Goal 1) Manage crisis and stabilize living situation 5 Point Likert rating baseline date: Target Date Goal Was reviewed Status Code Progress towards goal/Likert rating  06/25/2022           O              Goal 2) Facilitate decision making and provide guidance while making a determination about his marriage 5 Point Likert rating baseline date: Target Date Goal Was reviewed Status Code Progress towards goal/Likert rating  06/25/2022           O  Goal 3) Learn about the impact of depression and coping skills to manage depression 5 Point Likert rating baseline date: Target Date Goal Was reviewed Status Code Progress towards goal/Likert rating  06/25/2022           O              This plan has been reviewed and created by the following participants:  This plan will be reviewed at least every 12 months. Date Behavioral Health Clinician Date  Guardian/Patient   06/24/2021 Wayne [name], [name]   06/24/2021 Wayne [name]                    Anxiety Rating: 5-6 Depression Rating: 3   Wayne [name] reports that he had a root canal scheduled last week.   Verdell feels like he and his wife are making progress in moving forward.  However, Wayne [name] continues to struggle with her own personal crisis.  We d/e/p how to encourage her to reach out to a therapist to get the support she needs and how to address issues of shame she is struggling with.     Home Practice:  Buy Men with ADHD Workbook    Wayne Crochet, PhD   ______________________________________________________________  Wife: Wayne [name] -  Son: Wayne [name] - 10 Child: Wayne [name] - 8 ([name]- dead name)

## 2022-04-05 ENCOUNTER — Ambulatory Visit (INDEPENDENT_AMBULATORY_CARE_PROVIDER_SITE_OTHER): Payer: BC Managed Care – PPO | Admitting: Psychology

## 2022-04-05 DIAGNOSIS — F330 Major depressive disorder, recurrent, mild: Secondary | ICD-10-CM | POA: Diagnosis not present

## 2022-04-05 DIAGNOSIS — F411 Generalized anxiety disorder: Secondary | ICD-10-CM

## 2022-04-05 NOTE — Progress Notes (Signed)
PROGRESS NOTE:  Name: Wayne [name] Date: 04/05/2022 MRN: [id number] DOB: [birth year] PCP: Wayne Pitcher, NP  Time spent: 10:00 - 10:55 AM  Today I met with  Wayne [name] in remote video (WebEx) face-to-face individual psychotherapy.  Distance Site: Client's Home Orginating Site: Dr Wayne [name] Remote Office Consent: Obtained verbal consent to transmit session remotely    Presenting Problem:    Pt states that he recently discovered that his wife Wayne [name] had an affair.  Two and a half weeks ago he made the discovery.  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  She states that she doesn't want to give up the affair relationship but she also wants to keep the same living arrangements.  Pt states that his wife shared that his mental health issues contributed to their marital problems.  He was able to accept his responsibility for the problems in the relationship.  Wayne [name] states that they both have had a history of indiscretions.  For as long as he can remember he has had depression.  He states that he at different points in his life he sought out treatment but he never stuck with it.      Diagnoses:  No diagnosis found.   Individualized Treatment Plan                  Strengths: loves his family, hard worker  Supports: work friends   Goal/Needs for Treatment:  In order of importance to patient 1) Manage crisis and stabilize living situation 2) Facilitate decision making and provide guidance while making a determination about his marriage 3) Learn about the impact of depression and coping skills to manage depression   Client Statement of Needs: States he is in crisis after discovering his wife's affair.   Treatment Level: Outpatient Individual Psychotherapy  Symptoms:    GAD-7 = 20 (Severe Anxiety) feeling nervous, worrying too much and not able to control worry, trouble relaxing, restless, and fearful of terrible things  happening nearly every day.  Becoming easily annoyed over half the time.  PHQ-9 = 15 (Moderately Severe Depression) Feeling down and having trouble sleeping nearly everyday.  Little interest or pleasure in doing things, poor appetite, poor self esteem and trouble concentrating more than half the time.  Moving and speaking more slowly for several days in the past two weeks.    Client Treatment Preferences: needs a therapist with experience treating someone who has    Healthcare consumer's goal for treatment:  Psychologist, Wayne [name], [name] will support the patient's ability to achieve the goals identified. Cognitive Behavioral Therapy, Dialectical Behavioral Therapy, Motivational Interviewing, parent training, and other evidenced-based practices will be used to promote progress towards healthy functioning.   Healthcare consumer Wayne [name] will: Actively participate in therapy, working towards healthy functioning.    *Justification for Continuation/Discontinuation of Goal: R=Revised, O=Ongoing, A=Achieved, D=Discontinued  Goal 1) Manage crisis and stabilize living situation 5 Point Likert rating baseline date: Target Date Goal Was reviewed Status Code Progress towards goal/Likert rating  06/25/2022           O              Goal 2) Facilitate decision making and provide guidance while making a determination about his marriage 5 Point Likert rating baseline date: Target Date Goal Was reviewed Status Code Progress towards goal/Likert rating  06/25/2022           O  Goal 3) Learn about the impact of depression and coping skills to manage depression 5 Point Likert rating baseline date: Target Date Goal Was reviewed Status Code Progress towards goal/Likert rating  06/25/2022           O              This plan has been reviewed and created by the following participants:  This plan will be reviewed at least every 12 months. Date Behavioral Health Clinician Date  Guardian/Patient   06/24/2021 Wayne [name], [name]   06/24/2021 Wayne [name]                   Diagnosis: Major Depressive Disorder, recurrent, mild Generalized Anxiety Disorder  Anxiety Rating: 4-5 Depression Rating: 4-5   Wayne [name] reports that he and his wife are still at a stand still.  They seem to have trouble moving forward when she continues to maintain some connection to the man of the affair.  We d/e/p how he is feeling, ways in which he can set limits but not become demanding.  He also admitted that he often rushes home hoping to detract her from gaming with the man of the affair.    Wayne [name] states that he is trying to eat better as a way to support Wayne [name].  I asked him to consider what he would benefit from taking care of himself, for himself and not just to support his wife.  I encouraged him to go to the gym right after work instead of rushing home.  Wayne [name] agreed that he would feel better and rushing home won't change her behavior.  Home Practice:  Buy Men with ADHD Workbook    Wayne Crochet, PhD   ______________________________________________________________  Wife: Wayne [name] -  Son: Wayne [name] - 10 Child: Wayne [name] - 74 (Wayne [name]- dead name)

## 2022-04-19 ENCOUNTER — Ambulatory Visit (INDEPENDENT_AMBULATORY_CARE_PROVIDER_SITE_OTHER): Payer: BC Managed Care – PPO | Admitting: Psychology

## 2022-04-19 DIAGNOSIS — F330 Major depressive disorder, recurrent, mild: Secondary | ICD-10-CM

## 2022-04-19 NOTE — Progress Notes (Signed)
PROGRESS NOTE:  Name: Marieo Fipps Russon Date: 04/19/2022 MRN: [id number] DOB: 1980/07/10 PCP: [name], NP  Time spent: 10:00 - 10:55 AM  Today I met with  Herschell Dimes Delancy in remote video (Caregility) face-to-face individual psychotherapy.  Distance Site: Client's Home Orginating Site: Dr Odette Horns Remote Office Consent: Obtained verbal consent to transmit session remotely    Presenting Problem:    Pt states that he recently discovered that his wife [name] had an affair.  Two and a half weeks ago he made the discovery.  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  She states that she doesn't want to give up the affair relationship but she also wants to keep the same living arrangements.  Pt states that his wife shared that his mental health issues contributed to their marital problems.  He was able to accept his responsibility for the problems in the relationship.  Janos states that they both have had a history of indiscretions.  For as long as he can remember he has had depression.  He states that he at different points in his life he sought out treatment but he never stuck with it.      Diagnoses:  No diagnosis found.   Individualized Treatment Plan                  Strengths: loves his family, hard worker  Supports: work friends   Goal/Needs for Treatment:  In order of importance to patient 1) Manage crisis and stabilize living situation 2) Facilitate decision making and provide guidance while making a determination about his marriage 3) Learn about the impact of depression and coping skills to manage depression   Client Statement of Needs: States he is in crisis after discovering his wife's affair.   Treatment Level: Outpatient Individual Psychotherapy  Symptoms:    GAD-7 = 20 (Severe Anxiety) feeling nervous, worrying too much and not able to control worry, trouble relaxing, restless, and fearful of terrible things  happening nearly every day.  Becoming easily annoyed over half the time.  PHQ-9 = 15 (Moderately Severe Depression) Feeling down and having trouble sleeping nearly everyday.  Little interest or pleasure in doing things, poor appetite, poor self esteem and trouble concentrating more than half the time.  Moving and speaking more slowly for several days in the past two weeks.    Client Treatment Preferences: needs a therapist with experience treating someone who has    Healthcare consumer's goal for treatment:  Psychologist, [name], [name] will support the patient's ability to achieve the goals identified. Cognitive Behavioral Therapy, Dialectical Behavioral Therapy, Motivational Interviewing, parent training, and other evidenced-based practices will be used to promote progress towards healthy functioning.   Healthcare consumer Ana Belote will: Actively participate in therapy, working towards healthy functioning.    *Justification for Continuation/Discontinuation of Goal: R=Revised, O=Ongoing, A=Achieved, D=Discontinued  Goal 1) Manage crisis and stabilize living situation 5 Point Likert rating baseline date: Target Date Goal Was reviewed Status Code Progress towards goal/Likert rating  06/25/2022           O              Goal 2) Facilitate decision making and provide guidance while making a determination about his marriage 5 Point Likert rating baseline date: Target Date Goal Was reviewed Status Code Progress towards goal/Likert rating  06/25/2022           O  Goal 3) Learn about the impact of depression and coping skills to manage depression 5 Point Likert rating baseline date: Target Date Goal Was reviewed Status Code Progress towards goal/Likert rating  06/25/2022           O              This plan has been reviewed and created by the following participants:  This plan will be reviewed at least every 12 months. Date Behavioral Health Clinician Date  Guardian/Patient   06/24/2021 [name], [name]   06/24/2021 Nyles Celmer                   Diagnosis: Major Depressive Disorder, recurrent, mild Generalized Anxiety Disorder  Anxiety Rating: 4 Depression Rating: 5   Neng reports that his wife [name] "hit the wall" last week.  Her blood sugar got very dysregulated and so much has happened from there.  Their adult child Z also got involved and they were able to share and p/ a lot of what has been happening over the past year.  On a more positive note, she seems like she is more open to getting treatment for herself.  He has worked hard to keep positive and to keep everyone else steady.  I provided the support and guidance needed.  Lastly, Antrone and I d/e/p ongoing stresses at work, and how to he has been able to use his self talk skills to manage his anxiety more effectively.  He agreed to begin to focus on his ADHD issues more in therapy going forward and to purchase the workbook I recommended.   Home Practice:  Buy Men with ADHD Workbook - sent link   [name], PhD   ______________________________________________________________  Wife: [name] -  Son: [name] - 10 Child: Z - 36 ([name]- dead name)

## 2022-05-03 ENCOUNTER — Ambulatory Visit: Payer: BC Managed Care – PPO | Admitting: Psychology

## 2022-05-17 ENCOUNTER — Ambulatory Visit: Payer: BC Managed Care – PPO | Admitting: Psychology

## 2022-05-31 ENCOUNTER — Ambulatory Visit (INDEPENDENT_AMBULATORY_CARE_PROVIDER_SITE_OTHER): Payer: BC Managed Care – PPO | Admitting: Psychology

## 2022-05-31 DIAGNOSIS — F411 Generalized anxiety disorder: Secondary | ICD-10-CM | POA: Diagnosis not present

## 2022-05-31 NOTE — Progress Notes (Signed)
PROGRESS NOTE:  Name: Wayne [name] Date: 05/31/2022 MRN: [id number] DOB: October 05, 1980 [name]: Wayne Emms, NP  Time spent: 10:00 - 10:59 AM  Today I met with  Wayne [name] in remote video (Caregility) face-to-face individual psychotherapy.  Distance Site: Client's Home Orginating Site: Dr Odette Horns Remote Office Consent: Obtained verbal consent to transmit session remotely    Presenting Problem:    Pt [name] [name] he recently discovered [name] his wife Wayne [name] had an affair.  Two and a half weeks ago he made the discovery.  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  She [name] [name] she doesn't want to give up the affair relationship but she also wants to keep the same living arrangements.  Pt [name] [name] his wife shared [name] his mental health issues contributed to their marital problems.  He was able to accept his responsibility for the problems in the relationship.  Wayne [name] they both have had a history of indiscretions.  For as long as he can remember he has had depression.  He [name] [name] he at different points in his life he sought out treatment but he never stuck with it.      Diagnoses:  GAD (generalized anxiety disorder)   Individualized Treatment Plan                  Strengths: loves his family, hard worker  Supports: work friends   Goal/Needs for Treatment:  In order of importance to patient 1) Manage crisis and stabilize living situation 2) Facilitate decision making and provide guidance while making a determination about his marriage 3) Learn about the impact of depression and coping skills to manage depression   Client Statement of Needs: [name] he is in crisis after discovering his wife's affair.   Treatment Level: Outpatient Individual Psychotherapy  Symptoms:    GAD-7 = 20 (Severe Anxiety) feeling nervous, worrying too much and not able to control worry, trouble relaxing, restless, and fearful of  terrible things happening nearly every day.  Becoming easily annoyed over half the time.  PHQ-9 = 15 (Moderately Severe Depression) Feeling down and having trouble sleeping nearly everyday.  Little interest or pleasure in doing things, poor appetite, poor self esteem and trouble concentrating more than half the time.  Moving and speaking more slowly for several days in the past two weeks.    Client Treatment Preferences: needs a therapist with experience treating someone who has    Healthcare consumer's goal for treatment:  Psychologist, Wayne [name], [name] will support the patient's ability to achieve the goals identified. Cognitive Behavioral Therapy, Dialectical Behavioral Therapy, Motivational Interviewing, parent training, and other evidenced-based practices will be used to promote progress towards healthy functioning.   Healthcare consumer Wayne [name] will: Actively participate in therapy, working towards healthy functioning.    *Justification for Continuation/Discontinuation of Goal: R=Revised, O=Ongoing, A=Achieved, D=Discontinued  Goal 1) Manage crisis and stabilize living situation 5 Point Likert rating baseline date: Target Date Goal Was reviewed Status Code Progress towards goal/Likert rating  06/25/2022           O              Goal 2) Facilitate decision making and provide guidance while making a determination about his marriage 5 Point Likert rating baseline date: Target Date Goal Was reviewed Status Code Progress towards goal/Likert rating  06/25/2022           O  Goal 3) Learn about the impact of depression and coping skills to manage depression 5 Point Likert rating baseline date: Target Date Goal Was reviewed Status Code Progress towards goal/Likert rating  06/25/2022           O              This plan has been reviewed and created by the following participants:  This plan will be reviewed at least every 12 months. Date Behavioral Health Clinician  Date Guardian/Patient   06/24/2021 Wayne [name], [name]   06/24/2021 Wayne [name]                   Diagnosis: Major Depressive Disorder, recurrent, mild Generalized Anxiety Disorder  Anxiety Rating: 5 Depression Rating: 3   Wayne [name] [name] his wife Wayne [name] and their adult child Z had a "falling out."  He was caught in the middle, struggled to pull himself out of the middle and shared how he used his skills to manage these dynamics.  Wayne [name] [name] he f/u and listened to the audio book I recommended to him.  We d/e/p how he has had lifelong issues related to ADHD and co-occurring disorders.  He also shared some concerns about his son Wayne [name] who is on the Autism Spectrum and likely has ADHD.  He asked questions about medications as a possible treatment option. I asked his questions and provided additional psycho education.  Wayne [name] about medication.    Home Practice:  Buy Men with ADHD Workbook - sent link   Wayne Favors, PhD   ______________________________________________________________  Wife: Wayne [name] -  Son: Wayne [name] - 10 Child: Z - 37 ([name]- dead name)

## 2022-06-09 ENCOUNTER — Encounter: Payer: Self-pay | Admitting: Nurse Practitioner

## 2022-06-09 ENCOUNTER — Ambulatory Visit: Payer: BC Managed Care – PPO | Admitting: Nurse Practitioner

## 2022-06-09 VITALS — BP 118/70 | HR 64 | Temp 98.1°F | Resp 16 | Ht 74.0 in | Wt 188.0 lb

## 2022-06-09 DIAGNOSIS — L301 Dyshidrosis [pompholyx]: Secondary | ICD-10-CM | POA: Diagnosis not present

## 2022-06-09 DIAGNOSIS — R41840 Attention and concentration deficit: Secondary | ICD-10-CM | POA: Diagnosis not present

## 2022-06-09 DIAGNOSIS — F411 Generalized anxiety disorder: Secondary | ICD-10-CM | POA: Diagnosis not present

## 2022-06-09 DIAGNOSIS — F339 Major depressive disorder, recurrent, unspecified: Secondary | ICD-10-CM

## 2022-06-09 MED ORDER — TRIAMCINOLONE ACETONIDE 0.5 % EX CREA: 1.0000 | TOPICAL_CREAM | Freq: Two times a day (BID) | CUTANEOUS | 0 refills | Status: DC

## 2022-06-09 MED ORDER — BUPROPION HCL ER (XL) 150 MG PO TB24: 150.0000 mg | ORAL_TABLET | Freq: Every day | ORAL | 1 refills | Status: DC

## 2022-06-09 NOTE — Assessment & Plan Note (Signed)
History of the same seeing therapist discontinued sertraline due to adverse drug event not being able to climax.  Will try Wellbutrin 150 mg XL

## 2022-06-09 NOTE — Assessment & Plan Note (Signed)
Triamcinolone cream 1 application twice daily for elevated 7 days wound.  Topical steroid precautions reviewed with patient.

## 2022-06-09 NOTE — Assessment & Plan Note (Signed)
States psychologist did diagnose patient with ADHD.  I am not privileged but does note record release on.  Discussed different medications inclusive of Strattera, clonidine, Wellbutrin, stimulants.  Will start patient with Wellbutrin 100 mg XL daily.  Pending psychologist notes

## 2022-06-09 NOTE — Assessment & Plan Note (Signed)
Patient has hydroxyzine as needed at home.  He had adverse drug event to sertraline and has discontinued medication.  He is followed by therapist at least twice a month.  Will start Wellbutrin did have discussion with patient it can exacerbate underlying anxiety

## 2022-06-09 NOTE — Progress Notes (Signed)
Established Patient Office Visit  Subjective   Patient ID: Wayne [name], male    DOB: 09/14/1980  Age: 42 y.o. MRN: [id number]  Chief Complaint  Patient presents with   ADHD    HPI  ADHD: states that he is still seeing Wayne Bees, PhD. Started out once a week and is now doing 2 times a month  States that he was diagnosed as a child when he was about 11. States that he took ritalin and was taken off by his mother. States that he was just living with it. States that at certain points in life he would be more aware of it. Never followed up for treatment. States no great change but with talking with counsulor the effect it has on his life. He has issues with attention , irratiblity, folowwing up on task, and organizations.  Sttes that he is on the sertraline intermittently with sweating and inablity climax. States that he has been off the medication for approx 4-5 montjs.   States that he did try CBT with the counselor for aprox 3 months    States that personal life is still problematic. States that if he is in the moment he is ok. If he thnks too far ahead he will get anxitous.  States that he is still having trouble going to sleep. 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 There at 1230am and he will go to sleep aroun 6pm  Rash: At the end of visit patient mentions a rash on his hands.  Has been there for extended period of time.  States he went and saw a doctor about it when he was in the services.  States it does not itch does not hurt.  Worse with moisture in the summer per patient report.  Scattered on bilateral hands    Review of Systems  Constitutional:  Negative for chills and fever.  Respiratory:  Negative for shortness of breath.   Cardiovascular:  Negative for chest pain.  Neurological:  Negative for headaches.  Psychiatric/Behavioral:  Negative for hallucinations and suicidal ideas. The patient has insomnia. The patient is not nervous/anxious.        Objective:     BP 118/70   Pulse 64   Temp 98.1 F (36.7 C)   Resp 16   Ht 6\' 2"  (1.88 m)   Wt 188 lb (85.3 kg)   SpO2 100%   BMI 24.14 kg/m  BP Readings from Last 3 Encounters:  06/09/22 118/70  07/22/21 110/76  07/11/21 121/80   Wt Readings from Last 3 Encounters:  06/09/22 188 lb (85.3 kg)  07/22/21 177 lb 6 oz (80.5 kg)  07/11/21 182 lb (82.6 kg)      Physical Exam Vitals and nursing note reviewed.  Constitutional:      Appearance: Normal appearance.  Cardiovascular:     Rate and Rhythm: Normal rate and regular rhythm.     Heart sounds: Normal heart sounds.  Pulmonary:     Effort: Pulmonary effort is normal.     Breath sounds: Normal breath sounds.  Neurological:     Mental Status: He is alert.      No results found for any visits on 06/09/22.    The 10-year ASCVD risk score (Arnett DK, et al., 2019) is: 6.3%    Assessment & Plan:   Problem List Items Addressed This Visit       Musculoskeletal and Integument   Dyshidrotic eczema    Triamcinolone cream 1 application  twice daily for elevated 7 days wound.  Topical steroid precautions reviewed with patient.      Relevant Medications   triamcinolone cream (KENALOG) 0.5 %     Other   Depression, recurrent (HCC)    History of the same seeing therapist discontinued sertraline due to adverse drug event not being able to climax.  Will try Wellbutrin 150 mg XL      Relevant Medications   buPROPion (WELLBUTRIN XL) 150 MG 24 hr tablet   GAD (generalized anxiety disorder)    Patient has hydroxyzine as needed at home.  He had adverse drug event to sertraline and has discontinued medication.  He is followed by therapist at least twice a month.  Will start Wellbutrin did have discussion with patient it can exacerbate underlying anxiety      Relevant Medications   buPROPion (WELLBUTRIN XL) 150 MG 24 hr tablet   Concentration deficit - Primary    States psychologist did diagnose patient with ADHD.  I am  not privileged but does note record release on.  Discussed different medications inclusive of Strattera, clonidine, Wellbutrin, stimulants.  Will start patient with Wellbutrin 100 mg XL daily.  Pending psychologist notes      Relevant Medications   buPROPion (WELLBUTRIN XL) 150 MG 24 hr tablet    Return in about 4 weeks (around 07/07/2022) for CPE and Labs, med recheck .    Audria Nine, NP

## 2022-06-09 NOTE — Patient Instructions (Signed)
Nice to see you today Follow up with me in 1 month for you physical, labs and medication recheck

## 2022-06-14 ENCOUNTER — Ambulatory Visit (INDEPENDENT_AMBULATORY_CARE_PROVIDER_SITE_OTHER): Payer: BC Managed Care – PPO | Admitting: Psychology

## 2022-06-14 DIAGNOSIS — F900 Attention-deficit hyperactivity disorder, predominantly inattentive type: Secondary | ICD-10-CM

## 2022-06-14 DIAGNOSIS — F330 Major depressive disorder, recurrent, mild: Secondary | ICD-10-CM

## 2022-06-14 NOTE — Progress Notes (Signed)
PROGRESS NOTE:  Name: Wayne [name] Date: 06/14/2022 MRN: [id number] DOB: 1980/09/08 PCP: [name], NP  Time spent: 10:00 - 10:59 AM  Today I met with  Wayne [name] in remote video (Caregility) face-to-face individual psychotherapy.  Distance Site: Client's Home Orginating Site: Dr Odette Horns Remote Office Consent: Obtained verbal consent to transmit session remotely    Presenting Problem:    Pt states that he recently discovered that his wife Wayne [name] had an affair.  Two and a half weeks ago he made the discovery.  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  She states that she doesn't want to give up the affair relationship but she also wants to keep the same living arrangements.  Pt states that his wife shared that his mental health issues contributed to their marital problems.  He was able to accept his responsibility for the problems in the relationship.  Ritchie states that they both have had a history of indiscretions.  For as long as he can remember he has had depression.  He states that he at different points in his life he sought out treatment but he never stuck with it.      Diagnoses:  No diagnosis found.   Individualized Treatment Plan                  Strengths: loves his family, hard worker  Supports: work friends   Goal/Needs for Treatment:  In order of importance to patient 1) Manage crisis and stabilize living situation 2) Facilitate decision making and provide guidance while making a determination about his marriage 3) Learn about the impact of depression and coping skills to manage depression   Client Statement of Needs: States he is in crisis after discovering his wife's affair.   Treatment Level: Outpatient Individual Psychotherapy  Symptoms:    GAD-7 = 20 (Severe Anxiety) feeling nervous, worrying too much and not able to control worry, trouble relaxing, restless, and fearful of terrible things  happening nearly every day.  Becoming easily annoyed over half the time.  PHQ-9 = 15 (Moderately Severe Depression) Feeling down and having trouble sleeping nearly everyday.  Little interest or pleasure in doing things, poor appetite, poor self esteem and trouble concentrating more than half the time.  Moving and speaking more slowly for several days in the past two weeks.    Client Treatment Preferences: needs a therapist with experience treating someone who has    Healthcare consumer's goal for treatment:  Psychologist, [name], [name] will support the patient's ability to achieve the goals identified. Cognitive Behavioral Therapy, Dialectical Behavioral Therapy, Motivational Interviewing, parent training, and other evidenced-based practices will be used to promote progress towards healthy functioning.   Healthcare consumer Sheikh Gerald will: Actively participate in therapy, working towards healthy functioning.    *Justification for Continuation/Discontinuation of Goal: R=Revised, O=Ongoing, A=Achieved, D=Discontinued  Goal 1) Manage crisis and stabilize living situation 5 Point Likert rating baseline date: Target Date Goal Was reviewed Status Code Progress towards goal/Likert rating  06/25/2022           O              Goal 2) Facilitate decision making and provide guidance while making a determination about his marriage 5 Point Likert rating baseline date: Target Date Goal Was reviewed Status Code Progress towards goal/Likert rating  06/25/2022           O  Goal 3) Learn about the impact of depression and coping skills to manage depression 5 Point Likert rating baseline date: Target Date Goal Was reviewed Status Code Progress towards goal/Likert rating  06/25/2022           O              This plan has been reviewed and created by the following participants:  This plan will be reviewed at least every 12 months. Date Behavioral Health Clinician Date  Guardian/Patient   06/24/2021 [name], [name]   06/24/2021 Titus Freimuth                   Diagnosis: Major Depressive Disorder, recurrent, mild Generalized Anxiety Disorder  Anxiety Rating: 5 Depression Rating: 7   Fortino reports that he spoke with his PCP regarding medication.  He shared the side effects he experiences with an SSRI (Sertraline).  They also d/ ADHD, stimulants and d/ alternatives (Bupropion) per our previous discussions.  He states that he was started on Welbutrin (150 mg).   As a related subject we d/ the importance of sleep hygiene, diet/nutrition, hydration and supplementation in supporting positive mental and cognitive health.     Home Practice:  Buy Men with ADHD Workbook - sent link   [name], PhD   ______________________________________________________________  Wife: Wayne [name] -  Son: Wayne [name] - 10 Child: Wayne [name] - 49 ([name] - dead name)

## 2022-06-28 ENCOUNTER — Ambulatory Visit (INDEPENDENT_AMBULATORY_CARE_PROVIDER_SITE_OTHER): Payer: BC Managed Care – PPO | Admitting: Psychology

## 2022-06-28 DIAGNOSIS — F330 Major depressive disorder, recurrent, mild: Secondary | ICD-10-CM | POA: Diagnosis not present

## 2022-06-28 DIAGNOSIS — F411 Generalized anxiety disorder: Secondary | ICD-10-CM | POA: Diagnosis not present

## 2022-06-28 NOTE — Progress Notes (Addendum)
PROGRESS NOTE:  Name: Wayne [name] Date: 06/28/2022 MRN: [id number] DOB: 1980/06/12 PCP: Wayne Emms, NP  Time spent: 10:02 - 10:59 AM  Today I met with  Wayne [name] in remote video (Caregility) face-to-face individual psychotherapy.  Distance Site: Client's Home Orginating Site: Dr Wayne [name] Remote Office Consent: Obtained verbal consent to transmit session remotely    Presenting Problem:    Pt states that he recently discovered that his wife Wayne [name] had an affair.  Two and a half weeks ago he made the discovery.  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  She states that she doesn't want to give up the affair relationship but she also wants to keep the same living arrangements.  Pt states that his wife shared that his mental health issues contributed to their marital problems.  He was able to accept his responsibility for the problems in the relationship.  Wayne [name] states that they both have had a history of indiscretions.  For as long as he can remember he has had depression.  He states that he at different points in his life he sought out treatment but he never stuck with it.      Diagnoses:  Depression, major, recurrent, mild (HCC)   Individualized Treatment Plan                  Strengths: loves his family, hard worker  Supports: work friends   Goal/Needs for Treatment:  In order of importance to patient 1) Manage crisis and stabilize living situation 2) Facilitate decision making and provide guidance while making a determination about his marriage 3) Learn about the impact of depression and coping skills to manage depression   Client Statement of Needs: States he is in crisis after discovering his wife's affair.   Treatment Level: Outpatient Individual Psychotherapy  Symptoms:    GAD-7 = 20 (Severe Anxiety) feeling nervous, worrying too much and not able to control worry, trouble relaxing, restless, and fearful  of terrible things happening nearly every day.  Becoming easily annoyed over half the time.  PHQ-9 = 15 (Moderately Severe Depression) Feeling down and having trouble sleeping nearly everyday.  Little interest or pleasure in doing things, poor appetite, poor self esteem and trouble concentrating more than half the time.  Moving and speaking more slowly for several days in the past two weeks.    Client Treatment Preferences: needs a therapist with experience treating someone who has    Healthcare consumer's goal for treatment:  Psychologist, Wayne [name], [name] will support the patient's ability to achieve the goals identified. Cognitive Behavioral Therapy, Dialectical Behavioral Therapy, Motivational Interviewing, parent training, and other evidenced-based practices will be used to promote progress towards healthy functioning.   Healthcare consumer Wayne [name] will: Actively participate in therapy, working towards healthy functioning.    *Justification for Continuation/Discontinuation of Goal: R=Revised, O=Ongoing, A=Achieved, D=Discontinued  Goal 1) Manage crisis and stabilize living situation 5 Point Likert rating baseline date: Target Date Goal Was reviewed Status Code Progress towards goal/Likert rating  07/25/2022           O              Goal 2) Facilitate decision making and provide guidance while making a determination about his marriage 5 Point Likert rating baseline date: Target Date Goal Was reviewed Status Code Progress towards goal/Likert rating  07/25/2022  O              Goal 3) Learn about the impact of depression and coping skills to manage depression 5 Point Likert rating baseline date: Target Date Goal Was reviewed Status Code Progress towards goal/Likert rating  07/25/2022           O              This plan has been reviewed and created by the following participants:  This plan will be reviewed at least every 12 months. Date Behavioral Health Clinician  Date Guardian/Patient   07/24/2021 Wayne [name], [name]   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  We d/e/p which aspects he was anxious about, re-framing upcoming changes as enjoyable new challenges and how excitement can feel a lot like anxiety.  Lastly, we d/ some family challenges, that his wife is ready to pursue therapy and ways to help with co-regulation when she gets dysregulated.  Note:  Pt has an acute issue which requires attention and therefore his annual review was postponed and at that time target dates and goals will be updated.  Home Practice:  Buy Men with ADHD Workbook - sent link   Wayne Favors, PhD   ______________________________________________________________  Wife: Wayne [name] -  Son: Wayne [name] - 10 Child: Wayne [name] - 2 (Wayne [name] - dead name)

## 2022-07-09 ENCOUNTER — Encounter: Payer: Self-pay | Admitting: Nurse Practitioner

## 2022-07-09 ENCOUNTER — Ambulatory Visit (INDEPENDENT_AMBULATORY_CARE_PROVIDER_SITE_OTHER): Payer: BC Managed Care – PPO | Admitting: Nurse Practitioner

## 2022-07-09 VITALS — BP 118/74 | HR 60 | Temp 98.9°F | Resp 16 | Ht 74.0 in | Wt 184.2 lb

## 2022-07-09 DIAGNOSIS — Z720 Tobacco use: Secondary | ICD-10-CM | POA: Diagnosis not present

## 2022-07-09 DIAGNOSIS — R7989 Other specified abnormal findings of blood chemistry: Secondary | ICD-10-CM | POA: Diagnosis not present

## 2022-07-09 DIAGNOSIS — Z23 Encounter for immunization: Secondary | ICD-10-CM

## 2022-07-09 DIAGNOSIS — E782 Mixed hyperlipidemia: Secondary | ICD-10-CM

## 2022-07-09 DIAGNOSIS — R41840 Attention and concentration deficit: Secondary | ICD-10-CM

## 2022-07-09 DIAGNOSIS — F411 Generalized anxiety disorder: Secondary | ICD-10-CM | POA: Diagnosis not present

## 2022-07-09 DIAGNOSIS — Z0000 Encounter for general adult medical examination without abnormal findings: Secondary | ICD-10-CM

## 2022-07-09 MED ORDER — AMPHETAMINE-DEXTROAMPHETAMINE 5 MG PO TABS
5.0000 mg | ORAL_TABLET | Freq: Two times a day (BID) | ORAL | 0 refills | Status: DC
Start: 2022-07-09 — End: 2022-09-02

## 2022-07-09 NOTE — Assessment & Plan Note (Signed)
Historical diagnosis pending vitamin D level today

## 2022-07-09 NOTE — Assessment & Plan Note (Signed)
Being followed by psychology has tried sertraline and Wellbutrin without good relief.  Has hydroxyzine as needed

## 2022-07-09 NOTE — Patient Instructions (Signed)
Nice to see you today I will be in touch with the labs once I have them Follow up with me  in 1 month for a medication check

## 2022-07-09 NOTE — Assessment & Plan Note (Signed)
Pending lipid panel today 

## 2022-07-09 NOTE — Assessment & Plan Note (Signed)
Tried Wellbutrin without good relief will do Adderall 5 mg twice daily.  Did have discussion that this could incite or make his anxiety worse.  Follow-up in 1 month for medication recheck

## 2022-07-09 NOTE — Assessment & Plan Note (Signed)
Patient has stopped additional cigarettes and is currently vaping will check urine microscopy for microscopic hematuria.

## 2022-07-09 NOTE — Assessment & Plan Note (Signed)
Discussed age-appropriate immunizations and screening exams.  Did review patient's personal, surgical, social, family histories.  Update tetanus vaccine today all other vaccinations up-to-date per age range.  Patient is too young for CRC screening or prostate cancer screening.  Patient was given information at discharge about preventative healthcare maintenance with anticipatory guidance.

## 2022-07-09 NOTE — Progress Notes (Signed)
Established Patient Office Visit  Subjective   Patient ID: Wayne [name], male    DOB: 13-Jun-1980  Age: 42 y.o. MRN: [id number]  Chief Complaint  Patient presents with   Annual Exam    HPI  Concentration deficit: Patient is being followed by psychology and was told that he has some attention deficit.  I did get the records to review.  Patient was started on Wellbutrin 150 mg daily and is here for recheck. States that he has notice any change in medication. States that his focus is still an issue   GAD: Patient was trialed on sertraline in the past and took himself off.  Patient is followed by psychology and doing therapy  for complete physical and follow up of chronic conditions.  Immunizations: -Tetanus: Completed in 2003, update today  -Influenza: out of season  -Shingles: too young -Pneumonia: too young   Diet: Fair diet. States that he is eating 1 solid meal and will do a smaller meal with snacks. States that he will drink lemonade and Gatorade  Exercise: No regular exercise.  Eye exam: PRN Dental exam: Completes semi-annually    Colonoscopy: too young, Lung Cancer Screening: Does not qualify    PSA: too young currently average risk   Sleep: states that he will sleep 5-7 pm he will go to sleep and will get up 1130-12 get up for his shift. States that he does not feel rested. States that he does snore        Review of Systems  Constitutional:  Negative for chills and fever.  Respiratory:  Negative for shortness of breath.   Cardiovascular:  Negative for chest pain and leg swelling.  Gastrointestinal:  Negative for abdominal pain, blood in stool, constipation, diarrhea, nausea and vomiting.       BM daily   Genitourinary:  Negative for dysuria and hematuria.  Neurological:  Positive for tingling (left lateral thigh). Negative for headaches.  Psychiatric/Behavioral:  Negative for hallucinations and suicidal ideas.       Objective:     BP 118/74   Pulse 60    Temp 98.9 F (37.2 C)   Resp 16   Ht 6\' 2"  (1.88 m)   Wt 184 lb 4 oz (83.6 kg)   SpO2 98%   BMI 23.66 kg/m  BP Readings from Last 3 Encounters:  07/09/22 118/74  06/09/22 118/70  07/22/21 110/76   Wt Readings from Last 3 Encounters:  07/09/22 184 lb 4 oz (83.6 kg)  06/09/22 188 lb (85.3 kg)  07/22/21 177 lb 6 oz (80.5 kg)      Physical Exam Vitals and nursing note reviewed. Exam conducted with a chaperone present Tresa Endo Fiscal, CMA).  Constitutional:      Appearance: Normal appearance.  HENT:     Right Ear: Tympanic membrane, ear canal and external ear normal.     Left Ear: Tympanic membrane, ear canal and external ear normal.     Mouth/Throat:     Mouth: Mucous membranes are moist.     Pharynx: Oropharynx is clear.  Eyes:     Extraocular Movements: Extraocular movements intact.     Pupils: Pupils are equal, round, and reactive to light.  Cardiovascular:     Rate and Rhythm: Normal rate and regular rhythm.     Pulses: Normal pulses.     Heart sounds: Normal heart sounds.  Pulmonary:     Effort: Pulmonary effort is normal.     Breath sounds: Normal breath sounds.  Abdominal:     General: Bowel sounds are normal. There is no distension.     Palpations: There is no mass.     Tenderness: There is no abdominal tenderness.     Hernia: No hernia is present.  Musculoskeletal:     Right lower leg: No edema.     Left lower leg: No edema.  Lymphadenopathy:     Cervical: No cervical adenopathy.  Skin:    General: Skin is warm.  Neurological:     General: No focal deficit present.     Mental Status: He is alert.     Deep Tendon Reflexes:     Reflex Scores:      Bicep reflexes are 2+ on the right side and 2+ on the left side.      Patellar reflexes are 2+ on the right side and 2+ on the left side.    Comments: Bilateral upper and lower extremity strength 5/5  Psychiatric:        Mood and Affect: Mood normal.        Behavior: Behavior normal.        Thought Content:  Thought content normal.        Judgment: Judgment normal.      No results found for any visits on 07/09/22.     The 10-year ASCVD risk score (Arnett DK, et al., 2019) is: 2%    Assessment & Plan:   Problem List Items Addressed This Visit       Other   Preventative health care - Primary    Discussed age-appropriate immunizations and screening exams.  Did review patient's personal, surgical, social, family histories.  Update tetanus vaccine today all other vaccinations up-to-date per age range.  Patient is too young for CRC screening or prostate cancer screening.  Patient was given information at discharge about preventative healthcare maintenance with anticipatory guidance.      Relevant Orders   TSH   CBC   Comprehensive metabolic panel   Tobacco abuse    Patient has stopped additional cigarettes and is currently vaping will check urine microscopy for microscopic hematuria.      Relevant Orders   Urine Microscopic   Mixed hyperlipidemia    Pending lipid panel today      Relevant Orders   Lipid panel   GAD (generalized anxiety disorder)    Being followed by psychology has tried sertraline and Wellbutrin without good relief.  Has hydroxyzine as needed      Relevant Orders   TSH   Low vitamin D level    Historical diagnosis pending vitamin D level today      Relevant Orders   VITAMIN D 25 Hydroxy (Vit-D Deficiency, Fractures)   Concentration deficit    Tried Wellbutrin without good relief will do Adderall 5 mg twice daily.  Did have discussion that this could incite or make his anxiety worse.  Follow-up in 1 month for medication recheck      Relevant Medications   amphetamine-dextroamphetamine (ADDERALL) 5 MG tablet   Other Visit Diagnoses     Need for prophylactic vaccination against diphtheria and tetanus       Relevant Orders   Tdap vaccine greater than or equal to 7yo IM       Return in about 4 weeks (around 08/06/2022) for Medication recheck .     Audria Nine, NP

## 2022-07-10 LAB — MICROSCOPIC EXAMINATION
Bacteria, UA: NONE SEEN
Casts: NONE SEEN /lpf
Epithelial Cells (non renal): NONE SEEN /hpf (ref 0–10)
WBC, UA: NONE SEEN /hpf (ref 0–5)

## 2022-07-10 LAB — URINALYSIS, COMPLETE
Bilirubin, UA: NEGATIVE
Glucose, UA: NEGATIVE
Ketones, UA: NEGATIVE
Leukocytes,UA: NEGATIVE
Nitrite, UA: NEGATIVE
Protein,UA: NEGATIVE
RBC, UA: NEGATIVE
Specific Gravity, UA: 1.023 (ref 1.005–1.030)
Urobilinogen, Ur: 0.2 mg/dL (ref 0.2–1.0)
pH, UA: 6.5 (ref 5.0–7.5)

## 2022-07-10 LAB — LIPID PANEL
Chol/HDL Ratio: 4.6 ratio (ref 0.0–5.0)
Cholesterol, Total: 198 mg/dL (ref 100–199)
HDL: 43 mg/dL (ref >39)
LDL Chol Calc (NIH): 126 mg/dL — ABNORMAL HIGH (ref 0–99)
Triglycerides: 164 mg/dL — ABNORMAL HIGH (ref 0–149)
VLDL Cholesterol Cal: 29 mg/dL (ref 5–40)

## 2022-07-10 LAB — CBC
Hematocrit: 48.6 % (ref 37.5–51.0)
Hemoglobin: 16.4 g/dL (ref 13.0–17.7)
MCH: 30.3 pg (ref 26.6–33.0)
MCHC: 33.7 g/dL (ref 31.5–35.7)
MCV: 90 fL (ref 79–97)
Platelets: 313 10*3/uL (ref 150–450)
RBC: 5.42 x10E6/uL (ref 4.14–5.80)
RDW: 12.6 % (ref 11.6–15.4)
WBC: 8 10*3/uL (ref 3.4–10.8)

## 2022-07-10 LAB — TSH: TSH: 1.35 u[IU]/mL (ref 0.450–4.500)

## 2022-07-10 LAB — COMPREHENSIVE METABOLIC PANEL
ALT: 25 IU/L (ref 0–44)
AST: 13 IU/L (ref 0–40)
Albumin: 4.9 g/dL (ref 4.1–5.1)
Alkaline Phosphatase: 73 IU/L (ref 44–121)
BUN/Creatinine Ratio: 15 (ref 9–20)
BUN: 15 mg/dL (ref 6–24)
Bilirubin Total: 0.5 mg/dL (ref 0.0–1.2)
CO2: 24 mmol/L (ref 20–29)
Calcium: 10.5 mg/dL — ABNORMAL HIGH (ref 8.7–10.2)
Chloride: 102 mmol/L (ref 96–106)
Creatinine, Ser: 1 mg/dL (ref 0.76–1.27)
Globulin, Total: 2.3 g/dL (ref 1.5–4.5)
Glucose: 85 mg/dL (ref 70–99)
Potassium: 4.5 mmol/L (ref 3.5–5.2)
Sodium: 141 mmol/L (ref 134–144)
Total Protein: 7.2 g/dL (ref 6.0–8.5)
eGFR: 97 mL/min/{1.73_m2} (ref >59)

## 2022-07-10 LAB — VITAMIN D 25 HYDROXY (VIT D DEFICIENCY, FRACTURES): Vit D, 25-Hydroxy: 23.4 ng/mL — ABNORMAL LOW (ref 30.0–100.0)

## 2022-07-12 ENCOUNTER — Ambulatory Visit (INDEPENDENT_AMBULATORY_CARE_PROVIDER_SITE_OTHER): Payer: BC Managed Care – PPO | Admitting: Psychology

## 2022-07-12 DIAGNOSIS — F411 Generalized anxiety disorder: Secondary | ICD-10-CM

## 2022-07-12 DIAGNOSIS — F330 Major depressive disorder, recurrent, mild: Secondary | ICD-10-CM

## 2022-07-12 NOTE — Progress Notes (Addendum)
PROGRESS NOTE:  Name: Wayne [name] Date: 07/12/2022 MRN: [id number] DOB: 1980-07-08 PCP: [name], NP  Time spent: 10:02 - 10:59 AM  Today I met with Wayne [name] in remote video (Caregility) face-to-face individual psychotherapy.  Distance Site: Client's Home Orginating Site: Dr Odette Horns Remote Office Consent: Obtained verbal consent to transmit session remotely.   Patient is aware of the inherent limitations in participating in virtual therapy.   Presenting Problem:    Pt states that he recently discovered that his wife Wayne [name] had an affair.  Two and a half weeks ago he made the discovery.  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  She states that she doesn't want to give up the affair relationship but she also wants to keep the same living arrangements.  Pt states that his wife shared that his mental health issues contributed to their marital problems.  He was able to accept his responsibility for the problems in the relationship.  Wayne [name] states that they both have had a history of indiscretions.  For as long as he can remember he has had depression.  He states that he at different points in his life he sought out treatment but he never stuck with it.      Individualized Treatment Plan                  Strengths: loves his family, hard worker  Supports: work friends   Goal/Needs for Treatment:  In order of importance to patient 1) Manage crisis and stabilize living situation 2) Facilitate decision making and provide guidance while making a determination about his marriage 3) Learn about the impact of depression and coping skills to manage depression   Client Statement of Needs: States he is in crisis after discovering his wife's affair.   Treatment Level: Outpatient Individual Psychotherapy  Symptoms:    GAD-7 = 20 (Severe Anxiety) feeling nervous, worrying too much and not able to control worry, trouble  relaxing, restless, and fearful of terrible things happening nearly every day.  Becoming easily annoyed over half the time.  PHQ-9 = 15 (Moderately Severe Depression) Feeling down and having trouble sleeping nearly everyday.  Little interest or pleasure in doing things, poor appetite, poor self esteem and trouble concentrating more than half the time.  Moving and speaking more slowly for several days in the past two weeks.    Client Treatment Preferences: needs a therapist with experience treating someone who has    Healthcare consumer's goal for treatment:  Psychologist, Wayne [name], [name] will support the patient's ability to achieve the goals identified. Cognitive Behavioral Therapy, Dialectical Behavioral Therapy, Motivational Interviewing, parent training, and other evidenced-based practices will be used to promote progress towards healthy functioning.   Healthcare consumer Wayne [name] will: Actively participate in therapy, working towards healthy functioning.    *Justification for Continuation/Discontinuation of Goal: R=Revised, O=Ongoing, A=Achieved, D=Discontinued  Goal 1) Manage crisis and stabilize living situation 5 Point Likert rating baseline date: Target Date Goal Was reviewed Status Code Progress towards goal/Likert rating  07/25/2022           O              Goal 2) Facilitate decision making and provide guidance while making a determination about his marriage 5 Point Likert rating baseline date: Target Date Goal Was reviewed Status Code Progress towards goal/Likert rating  07/25/2022  O              Goal 3) Learn about the impact of depression and coping skills to manage depression 5 Point Likert rating baseline date: Target Date Goal Was reviewed Status Code Progress towards goal/Likert rating  07/25/2022           O              This plan has been reviewed and created by the following participants:  This plan will be reviewed at least every 12  months. Date Behavioral Health Clinician Date Guardian/Patient   06/24/2021 Wayne [name], [name]   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  We d/e/p how his ADHD and these stressors can impact his efficiency.  He states that he met with his PCP and was prescribed Adderall.  Wayne [name] states that he realizes his work would easier if he could focus and bounce less between tasks.  I noted that it is important to create a work routine/system.  We d/ his typical day, individual work tasks, timeline and how to create a schedule w/ work blocks.  Wayne [name] states he felt less anxious and like he had a good plan by the end of the session.  Note: Pt has an acute issue which requires attention and therefore his annual review was postponed and at that time target dates and goals will be updated.   Home Practice: [facility] ADHD Work[facility]   Wayne Favors, PhD   ______________________________________________________________  Wife: Wayne [name] -  Wayne [name] - 10 Wayne [name] - 65 ([name] - dead name)

## 2022-07-13 ENCOUNTER — Encounter: Payer: Self-pay | Admitting: Nurse Practitioner

## 2022-07-13 ENCOUNTER — Other Ambulatory Visit: Payer: Self-pay | Admitting: Nurse Practitioner

## 2022-07-13 DIAGNOSIS — R7989 Other specified abnormal findings of blood chemistry: Secondary | ICD-10-CM

## 2022-07-13 MED ORDER — VITAMIN D (ERGOCALCIFEROL) 1.25 MG (50000 UNIT) PO CAPS
50000.0000 [IU] | ORAL_CAPSULE | ORAL | 0 refills | Status: AC
Start: 2022-07-13 — End: ?

## 2022-07-26 ENCOUNTER — Ambulatory Visit (INDEPENDENT_AMBULATORY_CARE_PROVIDER_SITE_OTHER): Payer: BC Managed Care – PPO | Admitting: Psychology

## 2022-07-26 DIAGNOSIS — F411 Generalized anxiety disorder: Secondary | ICD-10-CM

## 2022-07-26 DIAGNOSIS — F330 Major depressive disorder, recurrent, mild: Secondary | ICD-10-CM

## 2022-07-26 DIAGNOSIS — F900 Attention-deficit hyperactivity disorder, predominantly inattentive type: Secondary | ICD-10-CM

## 2022-07-26 NOTE — Progress Notes (Addendum)
PROGRESS NOTE:  Name: Wayne [name] Date: 07/26/2022 MRN: [id number] DOB: 05-Apr-1980 PCP: [name], NP  Time spent: 10:01 AM - 11:00 AM  Today I met with Wayne [name] in remote video (Caregility) face-to-face individual psychotherapy.  Distance Site: Client's Home Orginating Site: Dr Odette Horns Remote Office Consent: Obtained verbal consent to transmit session remotely.   Patient is aware of the inherent limitations in participating in virtual therapy.   Presenting Problem:    Wayne [name] is a 42 year old WMM with a history of depression.  Pt states that he recently discovered that his wife Wayne [name] had an affair.  Two and a half weeks ago he made the discovery.  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  She states that she doesn't want to give up the affair relationship but she also wants to keep the same living arrangements.  Pt states that his wife shared that his mental health issues contributed to their Wayne [name].  He was able to accept his responsibility for the [name] in the relationship.  Wayne [name] states that they both have had a history of indiscretions.  For as long as he can remember he has had depression.  He states that he at different points in his life he sought out treatment but he never stuck with it.      Individualized Treatment Plan                  Strengths: loves his family, hard worker  Supports: work friends   Goal/Needs for Treatment:  In order of importance to patient 1) Manage crisis and stabilize living situation 2) Facilitate decision making and provide guidance while making a determination about his marriage 3) Learn about the impact of depression and coping skills to manage depression   Client Statement of Needs: States he is in crisis after discovering his wife's affair.   Treatment Level: Outpatient Individual Psychotherapy  Symptoms:    GAD-7 = 20 (Severe Anxiety) feeling  nervous, worrying too much and not able to control worry, trouble relaxing, restless, and fearful of terrible things happening nearly every day.  Becoming easily annoyed over half the time.  PHQ-9 = 15 (Moderately Severe Depression) Feeling down and having trouble sleeping nearly everyday.  Little interest or pleasure in doing things, poor appetite, poor self esteem and trouble concentrating more than half the time.  Moving and speaking more slowly for several days in the past two weeks.    Client Treatment Preferences: needs a therapist with experience treating someone who has    Healthcare consumer's goal for treatment:  Psychologist, Wayne [name], [name] will support the patient's ability to achieve the goals identified. Cognitive Behavioral Therapy, Dialectical Behavioral Therapy, Motivational Interviewing, parent training, and other evidenced-based practices will be used to promote progress towards healthy functioning.   Healthcare consumer Wayne [name] will: Actively participate in therapy, working towards healthy functioning.    *Justification for Continuation/Discontinuation of Goal: R=Revised, O=Ongoing, A=Achieved, D=Discontinued  Goal 1) Manage crisis and stabilize living situation 5 Point Likert rating baseline date: Target Date Goal Was reviewed Status Code Progress towards goal/Likert rating  06/25/2022           O              Goal 2) Facilitate decision making and provide guidance while making a determination about his marriage 5 Point Likert rating baseline date: Target Date Goal Was reviewed  Status Code Progress towards goal/Likert rating  08/02/2022           O              Goal 3) Learn about the impact of depression and coping skills to manage depression 5 Point Likert rating baseline date: Target Date Goal Was reviewed Status Code Progress towards goal/Likert rating  08/02/2022           O              This plan has been reviewed and created by the following  participants:  This plan will be reviewed at least every 12 months. Date Behavioral Health Clinician Date Guardian/Patient   06/24/2021 Wayne [name], [name]   06/24/2021 Wayne [name]                   Diagnosis: Major Depressive Disorder, recurrent, mild Generalized Anxiety Disorder  Anxiety Rating: 5-6 Depression Rating: 4  Note: Pt had an acute issue which required attention and therefore his annual review was postponed and at that time target dates and goals will be updated.  An additional appointment was scheduled next week (08/02/22) to follow upon the acute issue and to complete his annual review.   Wayne [name] reports that he has been experimenting with the timing of taking his medication.  He c/o that he was experiencing headaches and irritability.  He realized that he wasn't leaving enough time between doses and it resulted in unpleasant side effects.  Wayne [name] has seen a decrease in negative side effects as a result of allow more time in between doses.  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  We d/e/p the issues at length, and the potential for a high negative impact.  After a through review of the issues, we laid out an action plan and agreed how and what he would communicate to the persons involved.  Wayne [name] was still highly anxious about the potential for "blowing up."  I provided the support and guidance he required and then scheduled an additional appointment for the following week.  Pt know he can reach out before then should there be a need.  Home Practice: [facility] ADHD Workbook   Wayne Favors, PhD   ______________________________________________________________  Wife: Wayne [name] -  Son: Wayne [name] - 10 Child: Wayne [name] - 2 ([name] - dead name)

## 2022-08-02 ENCOUNTER — Ambulatory Visit (INDEPENDENT_AMBULATORY_CARE_PROVIDER_SITE_OTHER): Payer: BC Managed Care – PPO | Admitting: Psychology

## 2022-08-02 DIAGNOSIS — F411 Generalized anxiety disorder: Secondary | ICD-10-CM | POA: Diagnosis not present

## 2022-08-02 DIAGNOSIS — F900 Attention-deficit hyperactivity disorder, predominantly inattentive type: Secondary | ICD-10-CM

## 2022-08-02 DIAGNOSIS — F330 Major depressive disorder, recurrent, mild: Secondary | ICD-10-CM | POA: Diagnosis not present
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
## 2022-08-09 ENCOUNTER — Ambulatory Visit: Payer: BC Managed Care – PPO | Admitting: Psychology

## 2022-08-09 DIAGNOSIS — F330 Major depressive disorder, recurrent, mild: Secondary | ICD-10-CM | POA: Diagnosis not present

## 2022-08-09 DIAGNOSIS — F411 Generalized anxiety disorder: Secondary | ICD-10-CM
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
## 2022-08-11 ENCOUNTER — Encounter (INDEPENDENT_AMBULATORY_CARE_PROVIDER_SITE_OTHER): Payer: Self-pay

## 2022-08-23 ENCOUNTER — Ambulatory Visit: Payer: BC Managed Care – PPO | Admitting: Psychology

## 2022-08-23 DIAGNOSIS — F330 Major depressive disorder, recurrent, mild: Secondary | ICD-10-CM

## 2022-08-23 DIAGNOSIS — F411 Generalized anxiety disorder: Secondary | ICD-10-CM
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
## 2022-09-02 ENCOUNTER — Ambulatory Visit (INDEPENDENT_AMBULATORY_CARE_PROVIDER_SITE_OTHER): Payer: BC Managed Care – PPO | Admitting: Nurse Practitioner

## 2022-09-02 ENCOUNTER — Encounter: Payer: Self-pay | Admitting: Nurse Practitioner

## 2022-09-02 VITALS — BP 100/80 | HR 67 | Temp 99.1°F | Ht 73.0 in | Wt 184.8 lb

## 2022-09-02 DIAGNOSIS — F339 Major depressive disorder, recurrent, unspecified: Secondary | ICD-10-CM

## 2022-09-02 DIAGNOSIS — R7989 Other specified abnormal findings of blood chemistry: Secondary | ICD-10-CM

## 2022-09-02 DIAGNOSIS — F411 Generalized anxiety disorder: Secondary | ICD-10-CM | POA: Diagnosis not present

## 2022-09-02 MED ORDER — FLUOXETINE HCL 10 MG PO CAPS
ORAL_CAPSULE | ORAL | 0 refills | Status: AC
Start: 2022-09-02 — End: 2022-10-02

## 2022-09-02 NOTE — Progress Notes (Signed)
Established Patient Office Visit  Subjective   Patient ID: Wayne [name], male    DOB: [birth year]  Age: 42 y.o. MRN: [id number]  Chief Complaint  Patient presents with   ADD medication recheck    HPI  ADHD: Patient was seen by me on 07/09/2022 for a physical office visit before he has started on Wellbutrin.  He did not have an appreciable change in using Wellbutrin and patient was switched to Adderall 5 mg twice daily.  Patient is here for recheck. States that he did take the adderall. States that he did get some headaches,appetite loss and increased trouble sleeping. States that he has tried to wean down on the [facility]. States he went two weeks without smoking at all States that he felt the energy but no help with concentration while on Adderall. States that with his job he has to juggle several balls and is back and forth in that regard.  Medication has not helped with focusing on certain tasks  Largest part is motivation and not being able to enjoy things that he use to. States that he will sit and spend hours scrolling through videos no particular interest at that point.   States that he will struggle to get to sleep. States that he is working with the therapist to think ahead on things and trying to get a routine to help out. States that it does help a little      09/02/2022    9:55 AM 07/09/2022    9:45 AM 06/09/2022    9:51 AM  PHQ9 SCORE ONLY  PHQ-9 Total Score 19 22 19        09/02/2022    9:56 AM 07/09/2022    9:46 AM 06/09/2022    9:51 AM 07/22/2021   11:38 AM  GAD 7 : Generalized Anxiety Score  Nervous, Anxious, on Edge 3 3 3 2   Control/stop worrying 3 3 3 2   Worry too much - different things 3 3 3 2   Trouble relaxing 3 3 3 3   Restless 0 2 1 2   Easily annoyed or irritable 1 1 1 1   Afraid - awful might happen 1 3 3 2   Total GAD 7 Score 14 18 17 14   Anxiety Difficulty Very difficult Extremely difficult Extremely difficult Extremely difficult         Review of Systems   Constitutional:  Negative for chills and fever.  Respiratory:  Negative for shortness of breath.   Cardiovascular:  Negative for chest pain.  Neurological:  Negative for headaches.  Psychiatric/Behavioral:  Negative for hallucinations and suicidal ideas. The patient has insomnia.       Objective:     BP 100/80   Pulse 67   Temp 99.1 F (37.3 C) (Temporal)   Ht 6\' 1"  (1.854 m)   Wt 184 lb 12.8 oz (83.8 kg)   SpO2 98%   BMI 24.38 kg/m  BP Readings from Last 3 Encounters:  09/02/22 100/80  07/09/22 118/74  06/09/22 118/70   Wt Readings from Last 3 Encounters:  09/02/22 184 lb 12.8 oz (83.8 kg)  07/09/22 184 lb 4 oz (83.6 kg)  06/09/22 188 lb (85.3 kg)      Physical Exam Vitals and nursing note reviewed.  Constitutional:      Appearance: Normal appearance.  Cardiovascular:     Rate and Rhythm: Normal rate and regular rhythm.     Heart sounds: Normal heart sounds.  Pulmonary:     Effort: Pulmonary effort is normal.  Breath sounds: Normal breath sounds.  Neurological:     Mental Status: He is alert.      No results found for any visits on 09/02/22.    The 10-year ASCVD risk score (Arnett DK, et al., 2019) is: 1%    Assessment & Plan:   Problem List Items Addressed This Visit       Other   Depression, recurrent (HCC) - Primary    Currently in therapy.  Has tried Wellbutrin and sertraline in the past without good relief or with side effect.  Will start patient on fluoxetine 10 mg for 2 weeks titrate up to fluoxetine 20 mg daily.  Patient denies HI/SI/AVH.      Relevant Medications   FLUoxetine (PROZAC) 10 MG capsule   GAD (generalized anxiety disorder)    Patient has been on sertraline and Wellbutrin without good relief will try fluoxetine 20 mg daily.  Denies HI/SI/AVH.      Relevant Medications   FLUoxetine (PROZAC) 10 MG capsule   Low vitamin D level    Patient has not started vitamin D replacement yet.  Did inform patient I would check PTH  next office visit       Return in about 8 weeks (around 10/28/2022) for MDD/GAD recheck .    Audria Nine, NP

## 2022-09-02 NOTE — Assessment & Plan Note (Signed)
Currently in therapy.  Has tried Wellbutrin and sertraline in the past without good relief or with side effect.  Will start patient on fluoxetine 10 mg for 2 weeks titrate up to fluoxetine 20 mg daily.  Patient denies HI/SI/AVH.

## 2022-09-02 NOTE — Assessment & Plan Note (Signed)
Patient has not started vitamin D replacement yet.  Did inform patient I would check PTH next office visit

## 2022-09-02 NOTE — Assessment & Plan Note (Signed)
Patient has been on sertraline and Wellbutrin without good relief will try fluoxetine 20 mg daily.  Denies HI/SI/AVH.

## 2022-09-02 NOTE — Patient Instructions (Signed)
Nice to see you today I have sent the medication into the pharmacy  Follow up with me in 8 weeks, sooner if you need me

## 2022-09-06 ENCOUNTER — Ambulatory Visit: Payer: BC Managed Care – PPO | Admitting: Psychology

## 2022-09-20 ENCOUNTER — Ambulatory Visit (INDEPENDENT_AMBULATORY_CARE_PROVIDER_SITE_OTHER): Payer: BC Managed Care – PPO | Admitting: Psychology

## 2022-09-20 ENCOUNTER — Ambulatory Visit: Payer: BC Managed Care – PPO | Admitting: Psychology

## 2022-09-20 DIAGNOSIS — F330 Major depressive disorder, recurrent, mild: Secondary | ICD-10-CM

## 2022-09-20 DIAGNOSIS — F411 Generalized anxiety disorder: Secondary | ICD-10-CM
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
## 2022-10-04 ENCOUNTER — Ambulatory Visit: Payer: BC Managed Care – PPO | Admitting: Psychology

## 2022-10-18 ENCOUNTER — Ambulatory Visit: Payer: BC Managed Care – PPO | Admitting: Psychology

## 2022-10-25 ENCOUNTER — Ambulatory Visit (INDEPENDENT_AMBULATORY_CARE_PROVIDER_SITE_OTHER): Payer: BC Managed Care – PPO | Admitting: Psychology

## 2022-10-25 DIAGNOSIS — F330 Major depressive disorder, recurrent, mild: Secondary | ICD-10-CM

## 2022-10-25 DIAGNOSIS — F411 Generalized anxiety disorder: Secondary | ICD-10-CM | POA: Diagnosis not present
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
## 2022-10-28 ENCOUNTER — Ambulatory Visit: Payer: BC Managed Care – PPO | Admitting: Nurse Practitioner

## 2022-11-01 ENCOUNTER — Ambulatory Visit: Payer: BC Managed Care – PPO | Admitting: Psychology

## 2022-12-20 ENCOUNTER — Ambulatory Visit: Payer: BC Managed Care – PPO | Admitting: Psychology

## 2022-12-20 DIAGNOSIS — F900 Attention-deficit hyperactivity disorder, predominantly inattentive type: Secondary | ICD-10-CM

## 2022-12-20 DIAGNOSIS — F411 Generalized anxiety disorder: Secondary | ICD-10-CM | POA: Diagnosis not present

## 2022-12-20 DIAGNOSIS — F330 Major depressive disorder, recurrent, mild: Secondary | ICD-10-CM | POA: Diagnosis not present
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
## 2023-09-14 ENCOUNTER — Ambulatory Visit: Payer: Self-pay

## 2023-09-14 NOTE — Telephone Encounter (Addendum)
 Unable to reach pt x 3 attempts, already has next day appt scheduled.  Copied from CRM [id number]. Topic: Clinical - Red Word Triage >> Sep 14, 2023  8:20 AM Precious C wrote: [facility] that prompted transfer to Nurse Triage: SKIN RASH   Patient called in due to a skin rash he is currently experiencing. He stated that he has a history of eczema since childhood, but the current rash is very itchy, and he is concerned it may be a fungal infection rather than a flare-up of eczema.

## 2023-09-14 NOTE — Telephone Encounter (Signed)
 noted

## 2023-09-15 ENCOUNTER — Ambulatory Visit: Admitting: Family Medicine

## 2023-09-15 ENCOUNTER — Encounter: Payer: Self-pay | Admitting: Family Medicine

## 2023-09-15 DIAGNOSIS — L301 Dyshidrosis [pompholyx]: Secondary | ICD-10-CM

## 2023-09-15 MED ORDER — TRIAMCINOLONE ACETONIDE 0.5 % EX CREA: 1.0000 | TOPICAL_CREAM | Freq: Two times a day (BID) | CUTANEOUS | 0 refills | Status: AC

## 2023-09-15 MED ORDER — PREDNISONE 20 MG PO TABS: ORAL_TABLET | ORAL | 0 refills | Status: AC

## 2023-09-15 NOTE — Telephone Encounter (Signed)
 Appointment 09/15/23 with Dr. Avelina.

## 2023-09-15 NOTE — Assessment & Plan Note (Signed)
 Acute, rash appears most consistent with dyshidrotic eczema versus plant dermatitis versus other contact dermatitis. No clear sign of bacterial or fungal infection.  Will treat with prednisone  taper as well as topical triamcinolone  cream twice daily.  If not improving as expected he will contact us  for further recommendations and possible dermatology referral.

## 2023-09-15 NOTE — Progress Notes (Signed)
 Patient ID: Wayne [name], male    DOB: [birth year], 43 y.o.   MRN: [id number]  This visit was conducted in person.  BP 112/78   Pulse 61   Temp 98.9 F (37.2 C) (Oral)   Ht 6' 1 (1.854 m)   Wt 200 lb 6 oz (90.9 kg)   SpO2 99%   BMI 26.44 kg/m    CC:  Chief Complaint  Patient presents with   Rash    Pt C/o Rash, all over. Worse on R hand. Took benadryl and Loratadine.    Subjective:   HPI: Wayne [name] is a 43 y.o. male presenting on 09/15/2023 for Rash (Pt C/o Rash, all over. Worse on R hand. Took benadryl and Loratadine.)   New onset rash  in last week  Rash on right hand  interdigit space.   Very itchy  Red bumps on inner thighs and on shoulder. Some blister, clear  betwee on  4th and 5th digit.   Has had skin sensitivity issue life long... heat associated eczema like rash   Does have chronic fungal infection  tinea pedis.   No new meds.  Some yard work.  No new exposures.      Relevant past medical, surgical, family and social history reviewed and updated as indicated. Interim medical history since our last visit reviewed. Allergies and medications reviewed and updated. Outpatient Medications Prior to Visit  Medication Sig Dispense Refill   FLUoxetine  (PROZAC ) 10 MG capsule Take 1 capsule (10 mg total) by mouth daily for 15 days, THEN 2 capsules (20 mg total) daily for 15 days. (Patient not taking: No sig reported) 45 capsule 0   hydrOXYzine  (VISTARIL ) 25 MG capsule Take 1 capsule (25 mg total) by mouth 2 (two) times daily as needed for anxiety. (Patient not taking: Reported on 09/15/2023) 30 capsule 1   triamcinolone  cream (KENALOG ) 0.5 % Apply 1 Application topically 2 (two) times daily. (Patient not taking: Reported on 09/15/2023) 30 g 0   Vitamin D , Ergocalciferol , (DRISDOL ) 1.25 MG (50000 UNIT) CAPS capsule Take 1 capsule (50,000 Units total) by mouth every 7 (seven) days. (Patient not taking: Reported on 09/15/2023) 8 capsule 0   No facility-administered  medications prior to visit.     Per HPI unless specifically indicated in ROS section below Review of Systems  Constitutional:  Negative for fatigue and fever.  HENT:  Negative for ear pain.   Eyes:  Negative for pain.  Respiratory:  Negative for cough and shortness of breath.   Cardiovascular:  Negative for chest pain, palpitations and leg swelling.  Gastrointestinal:  Negative for abdominal pain.  Genitourinary:  Negative for dysuria.  Musculoskeletal:  Negative for arthralgias.  Neurological:  Negative for syncope, light-headedness and headaches.  Psychiatric/Behavioral:  Negative for dysphoric mood.    Objective:  BP 112/78   Pulse 61   Temp 98.9 F (37.2 C) (Oral)   Ht 6' 1 (1.854 m)   Wt 200 lb 6 oz (90.9 kg)   SpO2 99%   BMI 26.44 kg/m   Wt Readings from Last 3 Encounters:  09/15/23 200 lb 6 oz (90.9 kg)  09/02/22 184 lb 12.8 oz (83.8 kg)  07/09/22 184 lb 4 oz (83.6 kg)      Physical Exam Vitals reviewed.  Constitutional:      Appearance: He is well-developed.  HENT:     Head: Normocephalic.     Right Ear: Hearing normal.     Left Ear: Hearing normal.  Nose: Nose normal.  Neck:     Thyroid : No thyroid  mass or thyromegaly.     Vascular: No carotid bruit.     Trachea: Trachea normal.  Cardiovascular:     Rate and Rhythm: Normal rate and regular rhythm.     Pulses: Normal pulses.     Heart sounds: Heart sounds not distant. No murmur heard.    No friction rub. No gallop.     Comments: No peripheral edema Pulmonary:     Effort: Pulmonary effort is normal. No respiratory distress.     Breath sounds: Normal breath sounds.  Skin:    General: Skin is warm and dry.     Findings: No rash.     Comments: Dry flaky erythematous scaly rash between 4th and 5th digit on right hand, additional dry flaky papules across hand and in bilateral inner thighs and across upper shoulders, some excoriations present. No association with hair follicles or evidence of  pustules. No oral rash, no palmar or plantar rash.  Psychiatric:        Speech: Speech normal.        Behavior: Behavior normal.        Thought Content: Thought content normal.       Results for orders placed or performed in visit on 07/09/22  Microscopic Examination   Collection Time: 07/09/22  9:37 AM   BLD  Result Value Ref Range   WBC, UA None seen 0 - 5 /hpf   RBC, Urine 0-2 0 - 2 /hpf   Epithelial Cells (non renal) None seen 0 - 10 /hpf   Casts None seen None seen /lpf   Bacteria, UA None seen None seen/Few  VITAMIN D  25 Hydroxy (Vit-D Deficiency, Fractures)   Collection Time: 07/09/22  9:37 AM  Result Value Ref Range   Vit D, 25-Hydroxy 23.4 (L) 30.0 - 100.0 ng/mL  TSH   Collection Time: 07/09/22  9:37 AM  Result Value Ref Range   TSH 1.350 0.450 - 4.500 uIU/mL  Lipid panel   Collection Time: 07/09/22  9:37 AM  Result Value Ref Range   Cholesterol, Total 198 100 - 199 mg/dL   Triglycerides 835 (H) 0 - 149 mg/dL   HDL 43 >60 mg/dL   VLDL Cholesterol Cal 29 5 - 40 mg/dL   LDL Chol Calc (NIH) 873 (H) 0 - 99 mg/dL   Chol/HDL Ratio 4.6 0.0 - 5.0 ratio  Comprehensive metabolic panel   Collection Time: 07/09/22  9:37 AM  Result Value Ref Range   Glucose 85 70 - 99 mg/dL   BUN 15 6 - 24 mg/dL   Creatinine, Ser 8.99 0.76 - 1.27 mg/dL   eGFR 97 >40 fO/fpw/8.26   BUN/Creatinine Ratio 15 9 - 20   Sodium 141 134 - 144 mmol/L   Potassium 4.5 3.5 - 5.2 mmol/L   Chloride 102 96 - 106 mmol/L   CO2 24 20 - 29 mmol/L   Calcium 10.5 (H) 8.7 - 10.2 mg/dL   Total Protein 7.2 6.0 - 8.5 g/dL   Albumin 4.9 4.1 - 5.1 g/dL   Globulin, Total 2.3 1.5 - 4.5 g/dL   Bilirubin Total 0.5 0.0 - 1.2 mg/dL   Alkaline Phosphatase 73 44 - 121 IU/L   AST 13 0 - 40 IU/L   ALT 25 0 - 44 IU/L  CBC   Collection Time: 07/09/22  9:37 AM  Result Value Ref Range   WBC 8.0 3.4 - 10.8 x10E3/uL   RBC 5.42  4.14 - 5.80 x10E6/uL   Hemoglobin 16.4 13.0 - 17.7 g/dL   Hematocrit 51.3 62.4 - 51.0 %   MCV  90 79 - 97 fL   MCH 30.3 26.6 - 33.0 pg   MCHC 33.7 31.5 - 35.7 g/dL   RDW 87.3 88.3 - 84.5 %   Platelets 313 150 - 450 x10E3/uL  Urinalysis, Complete   Collection Time: 07/09/22  9:37 AM  Result Value Ref Range   Specific Gravity, UA 1.023 1.005 - 1.030   pH, UA 6.5 5.0 - 7.5   Color, UA Yellow Yellow   Appearance Ur Clear Clear   Leukocytes,UA Negative Negative   Protein,UA Negative Negative/Trace   Glucose, UA Negative Negative   Ketones, UA Negative Negative   RBC, UA Negative Negative   Bilirubin, UA Negative Negative   Urobilinogen, Ur 0.2 0.2 - 1.0 mg/dL   Nitrite, UA Negative Negative   Microscopic Examination Comment    Microscopic Examination See below:     Assessment and Plan  There are no diagnoses linked to this encounter.  No follow-ups on file.   Greig Ring, MD

## 2024-02-28 ENCOUNTER — Ambulatory Visit: Admitting: Nurse Practitioner
